# Patient Record
Sex: Male | Born: 1970 | Hispanic: Yes | Smoking: Current every day smoker
Health system: Southern US, Community
[De-identification: ages and names within clinical notes are randomized; demographics above are authoritative.]

## PROBLEM LIST (undated history)

## (undated) DIAGNOSIS — Z21 Asymptomatic human immunodeficiency virus [HIV] infection status: Secondary | ICD-10-CM

## (undated) DIAGNOSIS — J45909 Unspecified asthma, uncomplicated: Secondary | ICD-10-CM

## (undated) DIAGNOSIS — B20 Human immunodeficiency virus [HIV] disease: Secondary | ICD-10-CM

## (undated) DIAGNOSIS — E785 Hyperlipidemia, unspecified: Secondary | ICD-10-CM

## (undated) DIAGNOSIS — I639 Cerebral infarction, unspecified: Secondary | ICD-10-CM

## (undated) HISTORY — DX: Hyperlipidemia, unspecified: E78.5

## (undated) HISTORY — DX: Unspecified asthma, uncomplicated: J45.909

## (undated) HISTORY — PX: JOINT REPLACEMENT: SHX530

---

## 2017-08-29 ENCOUNTER — Encounter: Payer: Self-pay | Admitting: Pharmacist

## 2017-08-29 ENCOUNTER — Ambulatory Visit: Payer: Medicaid Other | Admitting: Pharmacist

## 2017-08-29 ENCOUNTER — Other Ambulatory Visit: Payer: Self-pay

## 2017-08-29 ENCOUNTER — Encounter (INDEPENDENT_AMBULATORY_CARE_PROVIDER_SITE_OTHER): Payer: Self-pay

## 2017-08-29 ENCOUNTER — Ambulatory Visit: Payer: Medicaid Other | Admitting: Pharmacy Technician

## 2017-08-29 DIAGNOSIS — Z79899 Other long term (current) drug therapy: Secondary | ICD-10-CM

## 2017-08-29 NOTE — Progress Notes (Addendum)
  [facility] Visit Note  Patient: Cody Amenedro Rasmussen Jr. MRN: [id number] Date of Birth: [birth year] PCP: No primary care provider on file.   Cody AmenPedro Cordle Jr. 47 y.o. male presents for an initial medication therapy management visit today.  There were no vitals taken for this visit.  Patient Information  No past medical history on file.   History reviewed. No pertinent surgical history.  No family history on file.  New Diagnoses (since last visit): NA  Family Support: Good         Social History   Substance and Sexual Activity  Alcohol Use Not on file    Social History   Tobacco Use  Smoking Status Not on file    Health Maintenance  Topic Date Due  . HIV Screening  06/05/1985  . TETANUS/TDAP  06/05/1989  . INFLUENZA VACCINE  12/07/2017   Outpatient Encounter Medications as of 08/29/2017  Medication Sig  . atorvastatin (LIPITOR) 40 MG tablet Take 40 mg by mouth daily.  . benztropine (COGENTIN) 1 MG tablet Take 1 mg by mouth at bedtime.  . divalproex (DEPAKOTE ER) 500 MG 24 hr tablet Take 1,000 mg by mouth at bedtime.  . haloperidol (HALDOL) 10 MG tablet Take 10 mg by mouth at bedtime.   No facility-administered encounter medications on file as of 08/29/2017.     Assessment and Plan:  Medication Compliance: Patient is able to correctly state the names of his medications (and previously discontinued medications), their indications, and dosing frequency with no prompting. He states that he does not use a pill box and would not be interested in using one because he never misses doses of his medications.    Dyslipidemia: Patient currently taking atorvastatin, well tolerated and well controlled. No labs available for review today.   Mental Health: Patient currently taking haloperidol, divalproex and benztropine. States that he feels that this combination of medications works best for him. He has previously been on dilantin and lithium which he feels did not  help. States that he knows the importance of taking his medications and will not let himself miss doses because he wants to stay out of the hospital. Sates that he dose feel somewhat drowsy throughout the day, despite taking his medications around 8 pm each night, but does not want to change anything about his current regimen as this is the best he has ever felt. Sates that if needed he can nap during the day and this helps him feel better.   Smoking Cessation: Patient currently smokes 3-4 cigarettes per day and dips when he is not smoking. States that he would like to quit smoking and dipping, and try NRT patches. Congratulated him on this decision and had him fill out a [employer] fax referral form during the visit which will be faxed today.    RTC: 1 year  Cori RazorLauren Markanthony Gedney, PharmD Candidate   Cosigned:Christan Leonor LivHolt, PharmD, RPh [facility] [phone]

## 2017-08-29 NOTE — Progress Notes (Signed)
Completed [facility] application and contract.  Patient agreed to all terms of the [facility] contract.    Patient will be approved to receive his HIV medications and some additional medications through the [facility] beginning 09/07/17.  [facility] for medications not available to patient through [facility].    Provided patient with community resource material based on his particular needs.    [employer] Prescription Application completed with patient.  [facility] for [facility].  Upon receipt of signed application from provider, [employer] Prescription Application will be submitted to manufacturer.  Sherilyn DacostaBetty J. Cecillia Menees Care Manager [facility]

## 2018-01-18 ENCOUNTER — Emergency Department
Admission: EM | Admit: 2018-01-18 | Discharge: 2018-01-18 | Disposition: A | Discharge status: Home / Self Care | Payer: Medicaid Other | Attending: Emergency Medicine | Admitting: Emergency Medicine

## 2018-01-18 ENCOUNTER — Emergency Department: Payer: Medicaid Other

## 2018-01-18 ENCOUNTER — Other Ambulatory Visit: Payer: Self-pay

## 2018-01-18 DIAGNOSIS — F17210 Nicotine dependence, cigarettes, uncomplicated: Secondary | ICD-10-CM | POA: Insufficient documentation

## 2018-01-18 DIAGNOSIS — Z21 Asymptomatic human immunodeficiency virus [HIV] infection status: Secondary | ICD-10-CM | POA: Insufficient documentation

## 2018-01-18 DIAGNOSIS — B20 Human immunodeficiency virus [HIV] disease: Secondary | ICD-10-CM

## 2018-01-18 DIAGNOSIS — Z79899 Other long term (current) drug therapy: Secondary | ICD-10-CM | POA: Insufficient documentation

## 2018-01-18 DIAGNOSIS — B59 Pneumocystosis: Secondary | ICD-10-CM | POA: Insufficient documentation

## 2018-01-18 HISTORY — DX: Human immunodeficiency virus (HIV) disease: B20

## 2018-01-18 HISTORY — DX: Asymptomatic human immunodeficiency virus (hiv) infection status: Z21

## 2018-01-18 MED ORDER — SULFAMETHOXAZOLE-TRIMETHOPRIM 800-160 MG PO TABS: 1.0000 | ORAL_TABLET | Freq: Two times a day (BID) | ORAL | 0 refills | Status: DC

## 2018-01-18 MED ORDER — ALBUTEROL SULFATE (2.5 MG/3ML) 0.083% IN NEBU
2.5000 mg | INHALATION_SOLUTION | Freq: Once | RESPIRATORY_TRACT | Status: AC
  Administered 2018-01-18: 2.5 mg via RESPIRATORY_TRACT
  Filled 2018-01-18: qty 3

## 2018-01-18 MED ORDER — ALBUTEROL SULFATE HFA 108 (90 BASE) MCG/ACT IN AERS: 2.0000 | INHALATION_SPRAY | RESPIRATORY_TRACT | 0 refills | Status: DC | PRN

## 2018-01-18 MED ORDER — PREDNISONE 50 MG PO TABS: 50.0000 mg | ORAL_TABLET | Freq: Every day | ORAL | 0 refills | Status: DC

## 2018-01-18 MED ORDER — SULFAMETHOXAZOLE-TRIMETHOPRIM 800-160 MG PO TABS
1.0000 | ORAL_TABLET | Freq: Once | ORAL | Status: AC
  Administered 2018-01-18: 1 via ORAL
  Filled 2018-01-18: qty 1

## 2018-01-18 MED ORDER — METHYLPREDNISOLONE SODIUM SUCC 125 MG IJ SOLR
125.0000 mg | Freq: Once | INTRAMUSCULAR | Status: AC
  Administered 2018-01-18: 125 mg via INTRAMUSCULAR
  Filled 2018-01-18: qty 2

## 2018-01-18 NOTE — ED Notes (Signed)
Pt stated that he has had a cough for the past 1.5 months and then  [facility] and has wheezing at night time. Pt stated that his cough is productive and it is green and yellow. Respirations even/unlabored. Pt denies any fever, chills, n/v/d or pain.

## 2018-01-18 NOTE — ED Provider Notes (Signed)
[facility] Emergency Department Provider Note  ____________________________________________  Time seen: Approximately 9:23 PM  I have reviewed the triage vital signs and the nursing notes.   HISTORY  Chief Complaint Cough    HPI Cody Jallow. is a 47 y.o. male who presents the emergency department complaining of cough, shortness of breath, wheezing, fatigue.  Patient reports that he has had increasing symptoms over the last  5 to 6 weeks.  Patient reports that he does have HIV, his CD4 count is undetectable.  He has been following his antiviral regimen.  Patient states that initially he thought he had some mild asthma or allergy symptoms.  He has had no nasal congestion, ear pain, sore throat.  He has had subjective, tactile intermittent temperatures.  He denies any currently.  Patient reports that over the several weeks, he has had progressive shortness of breath, progressive fatigue, progressive coughing, increased sputum production.  Patient reports that most of the sputum occurs in the morning and improves throughout the day.  Patient denies any headache, visual changes, neck pain or stiffness, chest pain, abdominal pain, nausea or vomiting, diarrhea or constipation.   History reviewed. No pertinent past medical history.  There are no active problems to display for this patient.   History reviewed. No pertinent surgical history.  Prior to Admission medications   Medication Sig Start Date End Date Taking? Authorizing Provider  albuterol (PROVENTIL HFA;VENTOLIN HFA) 108 (90 Base) MCG/ACT inhaler Inhale 2 puffs into the lungs every 4 (four) hours as needed for wheezing or shortness of breath. 01/18/18   Argie Lober, Delorise Royals, PA-C  atorvastatin (LIPITOR) 40 MG tablet Take 40 mg by mouth daily.    [provider]  benztropine (COGENTIN) 1 MG tablet Take 1 mg by mouth at bedtime.    [provider]  divalproex (DEPAKOTE ER) 500 MG 24 hr tablet  Take 1,000 mg by mouth at bedtime.    [provider]  haloperidol (HALDOL) 10 MG tablet Take 10 mg by mouth at bedtime.    [provider]  predniSONE (DELTASONE) 50 MG tablet Take 1 tablet (50 mg total) by mouth daily with breakfast. 01/18/18   Sheila Gervasi, Delorise Royals, PA-C  sulfamethoxazole-trimethoprim (BACTRIM DS,SEPTRA DS) 800-160 MG tablet Take 1 tablet by mouth 2 (two) times daily. 01/18/18   Hanah Moultry, Delorise Royals, PA-C    Allergies Penicillins  History reviewed. No pertinent family history.  Social History Social History   Tobacco Use  . Smoking status: Current Every Day Smoker    Packs/day: 0.40    Types: Cigarettes  . Smokeless tobacco: Current User    Types: Chew  Substance Use Topics  . Alcohol use: Not Currently  . Drug use: Not Currently    Types: "Crack" cocaine, Methamphetamines     Review of Systems  Constitutional: Intermittent, subjective, tactile fever/chills.  Increased fatigue. Eyes: No visual changes. No discharge ENT: No upper respiratory complaints. Cardiovascular: no chest pain. Respiratory: Positive cough.  Mild SOB. Gastrointestinal: No abdominal pain.  No nausea, no vomiting.  No diarrhea.  No constipation. Genitourinary: Negative for dysuria. No hematuria Musculoskeletal: Negative for musculoskeletal pain. Skin: Negative for rash, abrasions, lacerations, ecchymosis. Neurological: Negative for headaches, focal weakness or numbness. 10-point ROS otherwise negative.  ____________________________________________   PHYSICAL EXAM:  VITAL SIGNS: ED Triage Vitals  Enc Vitals Group     BP 01/18/18 1850 (!) 153/81     Pulse Rate 01/18/18 1850 70     Resp 01/18/18 1850 16  Temp 01/18/18 1850 98.8 F (37.1 C)     Temp Source 01/18/18 1850 Oral     SpO2 01/18/18 1850 97 %     Weight 01/18/18 1849 186 lb (84.4 kg)     Height 01/18/18 1849 5\' 8"  (1.727 m)     Head Circumference --      Peak Flow --      Pain Score 01/18/18  1849 0     Pain Loc --      Pain Edu? --      Excl. in GC? --      Constitutional: Alert and oriented. Well appearing and in no acute distress. Eyes: Conjunctivae are normal. PERRL. EOMI. Head: Atraumatic. ENT:      Ears: EACs and TMs unremarkable bilaterally.      Nose: No congestion/rhinnorhea.      Mouth/Throat: Mucous membranes are moist.  Neck: No stridor.   Hematological/Lymphatic/Immunilogical: No cervical lymphadenopathy. Cardiovascular: Normal rate, regular rhythm. Normal S1 and S2.  Good peripheral circulation. Respiratory: Normal respiratory effort without tachypnea or retractions. Lungs few scattered expiratory wheezes, coarse breath sounds.  No rales or rhonchi.Peri [place] air entry to the bases with no decreased or absent breath sounds. Musculoskeletal: Full range of motion to all extremities. No gross deformities appreciated. Neurologic:  Normal speech and language. No gross focal neurologic deficits are appreciated.  Skin:  Skin is warm, dry and intact. No rash noted. Psychiatric: Mood and affect are normal. Speech and behavior are normal. Patient exhibits appropriate insight and judgement.   ____________________________________________   LABS (all labs ordered are listed, but only abnormal results are displayed)  Labs Reviewed - No data to display ____________________________________________  EKG   ____________________________________________  RADIOLOGY I personally viewed and evaluated these images as part of my medical decision making, as well as reviewing the written report by the radiologist.  I concur with radiologist finding of no acute consolidations.  Dg Chest 2 View  Result Date: 01/18/2018 CLINICAL DATA:  Patient with productive cough EXAM: CHEST - 2 VIEW COMPARISON:  None. FINDINGS: Normal cardiac and mediastinal contours. No consolidative pulmonary opacities. No pleural effusion or pneumothorax. IMPRESSION: No acute cardiopulmonary process.  Electronically Signed   By: Annia Belt M.D.   On: 01/18/2018 20:04    ____________________________________________    PROCEDURES  Procedure(s) performed:    Procedures    Medications  sulfamethoxazole-trimethoprim (BACTRIM DS,SEPTRA DS) 800-160 MG per tablet 1 tablet (has no administration in time range)  albuterol (PROVENTIL) (2.5 MG/3ML) 0.083% nebulizer solution 2.5 mg (has no administration in time range)  methylPREDNISolone sodium succinate (SOLU-MEDROL) 125 mg/2 mL injection 125 mg (has no administration in time range)     ____________________________________________   INITIAL IMPRESSION / ASSESSMENT AND PLAN / ED COURSE  Pertinent labs & imaging results that were available during my care of the patient were reviewed by me and considered in my medical decision making (see chart for details).  Review of the [place] CSRS was performed in accordance of the [facility] prior to dispensing any controlled drugs.  Clinical Course as of Jan 19 2144  Thu Jan 18, 2018  2134 Patient presents the emergency department complaining of 5 to 6-week history of increasing cough, shortness of breath, fatigue, intermittent, subjective fevers.  Patient presents afebrile, normal respiration rate, O2 sats in the upper 90 percentile.  Chest x-ray reveals no acute findings.  In discussing symptoms with patient, patient reports that he is HIV positive, undergoing antiviral therapy.  Last CD4 count, 6  months prior was undetectable.  Differential includes asthma exacerbation, bronchitis, pneumonia.  Given patient's HIV status, pneumocystis infection is also in the differential.  After lengthy discussion with the patient, I will treat patient empirically at this time on an outpatient basis for pneumocystis.  I advised patient to contact his primary care tomorrow for sputum test.  Patient is unable to provide a sample at this time in the emergency department.  No indication for CT scan, labs as patient is stable at  this time.  Patient is to follow-up with infectious disease who manages his HIV status for further testing and final diagnosis of pneumocystis.  Patient verbalizes understanding of likely diagnosis, need for follow-up.   [JC]  2139 Differential also includes Mycobacterium pneumonia, fungal infection, tuberculosis given HIV status.   [JC]    Clinical Course User Index [JC] Kaylee Trivett, Delorise RoyalsJonathan D, PA-C     Patient's diagnosis is consistent with pneumocystis pneumonia and HIV infection.  Patient presents the emergency department with a 6-week history of progressive cough, shortness of breath, increased sputum production, fatigue.  Patient is HIV positive.  He states that his last CD4 count was undetectable.  However when questioned whether this was viral load or CD4 count, patient is unsure.  He states that he takes his antiviral as prescribed and "does not really have any other problems.".  He reports that according to his provider he is well managed.  He is unsure of his providers name..  While differential includes bronchitis, pneumonia, viral URI, allergies the patient's history of HIV also includes pneumocystis pneumonia, TB, fungal infection.  Patient symptoms are consistent with pneumocystis pneumonia but he is unable to provide a sputum sample at this time.  Patient will be started on Bactrim for same and is advised to follow-up with either his primary care or infectious disease for further testing.  Patient verbalizes understanding of diagnosis, likely differential.  He verbalizes he will follow-up with infectious disease or primary care for further work-up as this condition may require ongoing testing and/or treatment.  Patient is given ED precautions to return to the ED for any worsening or new symptoms.     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 Despite best efforts to proofread, errors can occur which can change the meaning. Any change was purely unintentional.    Lanette HampshireCuthriell, Charlene Cowdrey D, PA-C 01/18/18 2157    Rockne MenghiniNorman, Anne-Caroline, MD 01/18/18 [phone]

## 2018-01-18 NOTE — ED Triage Notes (Signed)
Pt arrives to ED with cough x 1.5 months. States productive with green sputum. Denies fever. Alert, oriented.

## 2018-03-22 ENCOUNTER — Emergency Department: Payer: Medicaid Other

## 2018-03-22 ENCOUNTER — Emergency Department
Admission: EM | Admit: 2018-03-22 | Discharge: 2018-03-22 | Disposition: A | Discharge status: Home / Self Care | Payer: Medicaid Other | Attending: Emergency Medicine | Admitting: Emergency Medicine

## 2018-03-22 ENCOUNTER — Encounter: Payer: Self-pay | Admitting: Emergency Medicine

## 2018-03-22 ENCOUNTER — Other Ambulatory Visit: Payer: Self-pay

## 2018-03-22 DIAGNOSIS — Y929 Unspecified place or not applicable: Secondary | ICD-10-CM | POA: Insufficient documentation

## 2018-03-22 DIAGNOSIS — F17210 Nicotine dependence, cigarettes, uncomplicated: Secondary | ICD-10-CM | POA: Insufficient documentation

## 2018-03-22 DIAGNOSIS — W0110XA Fall on same level from slipping, tripping and stumbling with subsequent striking against unspecified object, initial encounter: Secondary | ICD-10-CM | POA: Insufficient documentation

## 2018-03-22 DIAGNOSIS — Y990 Civilian activity done for income or pay: Secondary | ICD-10-CM | POA: Insufficient documentation

## 2018-03-22 DIAGNOSIS — Y939 Activity, unspecified: Secondary | ICD-10-CM | POA: Insufficient documentation

## 2018-03-22 DIAGNOSIS — S43005A Unspecified dislocation of left shoulder joint, initial encounter: Secondary | ICD-10-CM | POA: Insufficient documentation

## 2018-03-22 DIAGNOSIS — Z79899 Other long term (current) drug therapy: Secondary | ICD-10-CM | POA: Insufficient documentation

## 2018-03-22 MED ORDER — KETAMINE HCL 10 MG/ML IJ SOLN
1.0000 mg/kg | Freq: Once | INTRAMUSCULAR | Status: AC
  Administered 2018-03-22: 84 mg via INTRAVENOUS
  Filled 2018-03-22: qty 1

## 2018-03-22 MED ORDER — SODIUM CHLORIDE 0.9 % IV BOLUS
1000.0000 mL | Freq: Once | INTRAVENOUS | Status: AC
  Administered 2018-03-22: 1000 mL via INTRAVENOUS

## 2018-03-22 MED ORDER — FENTANYL CITRATE (PF) 100 MCG/2ML IJ SOLN
50.0000 ug | INTRAMUSCULAR | Status: DC | PRN
  Administered 2018-03-22: 50 ug via INTRAVENOUS
  Filled 2018-03-22: qty 2

## 2018-03-22 NOTE — ED Provider Notes (Signed)
[facility] Emergency Department Provider Note  ____________________________________________   First MD Initiated Contact with Patient 03/22/18 1936     (approximate)  I have reviewed the triage vital signs and the nursing notes.   HISTORY  Chief Complaint Shoulder Injury   HPI Caillou Minus. is a 47 y.o. male with history of multiple shoulder dislocations as well as HIV who is presenting the emergency department today with a left shoulder dislocation.  He says that he slipped and fell at work, hitting the left shoulder against the ground.  He is not reporting any loss of consciousness.  Denies any headache or chest pain.  States that he has had surgery with hardware to left shoulder in the past due to football injury that was remote.  Says last time he was 2 or 3 PM this afternoon.   Past Medical History:  Diagnosis Date  . HIV (human immunodeficiency virus infection) (HCC)     There are no active problems to display for this patient.   History reviewed. No pertinent surgical history.  Prior to Admission medications   Medication Sig Start Date End Date Taking? Authorizing Provider  albuterol (PROVENTIL HFA;VENTOLIN HFA) 108 (90 Base) MCG/ACT inhaler Inhale 2 puffs into the lungs every 4 (four) hours as needed for wheezing or shortness of breath. 01/18/18   Cuthriell, Delorise Royals, PA-C  atorvastatin (LIPITOR) 40 MG tablet Take 40 mg by mouth daily.    [provider]  benztropine (COGENTIN) 1 MG tablet Take 1 mg by mouth at bedtime.    [provider]  divalproex (DEPAKOTE ER) 500 MG 24 hr tablet Take 1,000 mg by mouth at bedtime.    [provider]  haloperidol (HALDOL) 10 MG tablet Take 10 mg by mouth at bedtime.    [provider]  predniSONE (DELTASONE) 50 MG tablet Take 1 tablet (50 mg total) by mouth daily with breakfast. 01/18/18   Cuthriell, Delorise Royals, PA-C  sulfamethoxazole-trimethoprim (BACTRIM DS,SEPTRA DS)  800-160 MG tablet Take 1 tablet by mouth 2 (two) times daily. 01/18/18   Cuthriell, Delorise Royals, PA-C    Allergies Penicillins  No family history on file.  Social History Social History   Tobacco Use  . Smoking status: Current Every Day Smoker    Packs/day: 0.40    Types: Cigarettes  . Smokeless tobacco: Current User    Types: Chew  Substance Use Topics  . Alcohol use: Not Currently  . Drug use: Not Currently    Types: "Crack" cocaine, Methamphetamines    Review of Systems  Constitutional: No fever/chills Eyes: No visual changes. ENT: No sore throat. Cardiovascular: Denies chest pain. Respiratory: Denies shortness of breath. Gastrointestinal: No abdominal pain.  No nausea, no vomiting.  No diarrhea.  No constipation. Genitourinary: Negative for dysuria. Musculoskeletal: Negative for back pain. Skin: Negative for rash. Neurological: Negative for headaches, focal weakness or numbness.   ____________________________________________   PHYSICAL EXAM:  VITAL SIGNS: ED Triage Vitals  Enc Vitals Group     BP 03/22/18 1925 (!) 164/107     Pulse Rate 03/22/18 1925 (!) 107     Resp 03/22/18 1925 18     Temp 03/22/18 1925 98.2 F (36.8 C)     Temp Source 03/22/18 1925 Oral     SpO2 03/22/18 1925 99 %     Weight 03/22/18 1930 185 lb (83.9 kg)     Height 03/22/18 1930 5\' 8"  (1.727 m)     Head Circumference --  Peak Flow --      Pain Score 03/22/18 1929 10     Pain Loc --      Pain Edu? --      Excl. in GC? --     Constitutional: Alert and oriented.  Patient with distress.  Wincing.  Holding his left arm in place with his right arm.  Left upper extremity is flexed at the elbow and held in internal rotation. Eyes: Conjunctivae are normal.  Head: Atraumatic. Nose: No congestion/rhinnorhea. Mouth/Throat: Mucous membranes are moist.  Neck: No stridor.   Cardiovascular: Normal rate, regular rhythm. Grossly normal heart sounds.  Good peripheral circulation with equal  bilateral radial pulses. Respiratory: Normal respiratory effort.  No retractions. Lungs CTAB. Gastrointestinal: Soft and nontender. No distention. No CVA tenderness. Musculoskeletal: No lower extremity tenderness nor edema.  No joint effusions.  Left shoulder deformity.  Patient denies any sensation over the left deltoid to light touch.  5 out of 5 strength distally with equal and bilateral radial pulses.  Neurologic:  Normal speech and language. No gross focal neurologic deficits are appreciated. Skin:  Skin is warm, dry and intact. No rash noted. Psychiatric: Mood and affect are normal. Speech and behavior are normal.  ____________________________________________   LABS (all labs ordered are listed, but only abnormal results are displayed)  Labs Reviewed - No data to display ____________________________________________  EKG   ____________________________________________  RADIOLOGY  Anterior shoulder dislocation on left.  No acute fracture identified ____________________________________________   PROCEDURES  Procedure(s) performed:    .Sedation Date/Time: 03/22/2018 8:32 PM Performed by: Myrna BlazerSchaevitz, Neeka Urista Matthew, MD Authorized by: Myrna BlazerSchaevitz, Jozef Eisenbeis Matthew, MD   Consent:    Consent obtained:  Written (electronic informed consent)   Consent given by:  Parent   Risks discussed:  Allergic reaction, dysrhythmia, inadequate sedation, nausea, vomiting, respiratory compromise necessitating ventilatory assistance and intubation, prolonged sedation necessitating reversal and prolonged hypoxia resulting in organ damage   Alternatives discussed:  Analgesia without sedation Universal protocol:    Procedure explained and questions answered to patient or proxy's satisfaction: yes     Relevant documents present and verified: yes     Test results available and properly labeled: yes     Imaging studies available: yes     Required blood products, implants, devices, and special  equipment available: yes     Immediately prior to procedure a time out was called: yes     Patient identity confirmation method:  Arm band Indications:    Procedure performed:  Dislocation reduction Pre-sedation assessment:    Time since last food or drink:  5-6 hours   ASA classification: class 2 - patient with mild systemic disease     ASA classification comment:  Hiv.  compliant with meds.    Neck mobility: normal     Mouth opening:  3 or more finger widths   Thyromental distance:  4 finger widths   Mallampati score:  I - soft palate, uvula, fauces, pillars visible   Pre-sedation assessments completed and reviewed: airway patency     Pre-sedation assessment completed:  03/22/2018 8:33 PM Immediate pre-procedure details:    Reassessment: Patient reassessed immediately prior to procedure     Reviewed: vital signs, relevant labs/tests and NPO status     Verified: bag valve mask available, emergency equipment available, intubation equipment available, IV patency confirmed, oxygen available, reversal medications available and suction available   Procedure details (see MAR for exact dosages):    Preoxygenation:  Nasal cannula   Sedation:  Ketamine   Intra-procedure monitoring:  Blood pressure monitoring, continuous pulse oximetry, cardiac monitor, frequent vital sign checks and frequent LOC assessments   Intra-procedure events: none     Total Provider sedation time (minutes):  15 Post-procedure details:    Post-sedation assessment completed:  03/22/2018 9:54 PM   Attendance: Constant attendance by certified staff until patient recovered     Recovery: Patient returned to pre-procedure baseline     Post-sedation assessments completed and reviewed: airway patency, cardiovascular function, hydration status, mental status and respiratory function     Patient is stable for discharge or admission: yes     Patient tolerance:  Tolerated well, no immediate complications Comments:     Patient back  to baseline at this time.  Pain-free.  Reduction of dislocation Date/Time: 03/22/2018 9:55 PM Performed by: Myrna Blazer, MD Authorized by: Myrna Blazer, MD  Consent: Written consent obtained. Consent given by: patient and spouse Patient understanding: patient states understanding of the procedure being performed Patient consent: the patient's understanding of the procedure matches consent given Procedure consent: procedure consent matches procedure scheduled Relevant documents: relevant documents present and verified Test results: test results available and properly labeled Patient identity confirmed: verbally with patient  Sedation: Patient sedated: yes Sedatives: ketamine  Patient tolerance: Patient tolerated the procedure well with no immediate complications Comments: Patient sedated with ketamine.  Gentle downward traction pulled on the left forearm and arm with a flexed elbow on the left and the joint reduced smoothly and easily.  Patient placed in a shoulder immobilizer.  Patient with 5 out of 5 strength distally and with sensation is intact over the deltoid on the left.     Critical Care performed:   ____________________________________________   INITIAL IMPRESSION / ASSESSMENT AND PLAN / ED COURSE  Pertinent labs & imaging results that were available during my care of the patient were reviewed by me and considered in my medical decision making (see chart for details).  DDX: Shoulder dislocation, shoulder fracture, As part of my medical decision making, I reviewed the following data within the electronic MEDICAL RECORD NUMBER Notes from prior ED visits  ----------------------------------------- 9:57 PM on 03/22/2018 -----------------------------------------  Patient back to baseline at this time.  Will remain in shoulder immobilizer.  He is aware of this plan as well as for follow-up with orthopedics here at the hospital.  Prior to the reduction I  discussed case Dr. Odis Luster who said there was no modification of the reduction procedure because of the patient's previous shoulder surgery.  Patient aware of successful reduction as well. ____________________________________________   FINAL CLINICAL IMPRESSION(S) / ED DIAGNOSES  Shoulder dislocation.   NEW MEDICATIONS STARTED DURING THIS VISIT:  New Prescriptions   No medications on file     Note:  This document was prepared using Dragon voice recognition software and may include unintentional dictation errors.     Myrna Blazer, MD 03/22/18 2157

## 2018-03-22 NOTE — ED Notes (Signed)
Pt is arousable by touch. Pt is able to squeeze the left hand. Dr Pershing ProudSchaevitz is at the bedside.

## 2018-03-22 NOTE — ED Notes (Signed)
Left shoulder reduced. Pt tolerated well. Pt eyes open at times but pt is cooperative and not resistant to care.

## 2018-03-22 NOTE — ED Triage Notes (Signed)
Patient to ER for c/o "dislocated shoulder". Patient states he fell approx one hour ago, has h/o the same with resulting surgery.

## 2018-03-22 NOTE — ED Notes (Signed)
Pt reports no pain at this time. This RN continues to stay at the bedside.

## 2018-03-22 NOTE — ED Notes (Signed)
Pt verbalized understanding as well as fiance. Pt is alert and oriented.

## 2018-06-09 ENCOUNTER — Other Ambulatory Visit: Payer: Self-pay

## 2018-06-09 ENCOUNTER — Emergency Department
Admission: EM | Admit: 2018-06-09 | Discharge: 2018-06-09 | Disposition: A | Discharge status: Home / Self Care | Payer: Medicaid Other | Attending: Emergency Medicine | Admitting: Emergency Medicine

## 2018-06-09 ENCOUNTER — Emergency Department: Payer: Medicaid Other

## 2018-06-09 ENCOUNTER — Encounter: Payer: Self-pay | Admitting: Emergency Medicine

## 2018-06-09 DIAGNOSIS — Z79899 Other long term (current) drug therapy: Secondary | ICD-10-CM | POA: Insufficient documentation

## 2018-06-09 DIAGNOSIS — J209 Acute bronchitis, unspecified: Secondary | ICD-10-CM | POA: Insufficient documentation

## 2018-06-09 DIAGNOSIS — F17210 Nicotine dependence, cigarettes, uncomplicated: Secondary | ICD-10-CM | POA: Insufficient documentation

## 2018-06-09 DIAGNOSIS — Z21 Asymptomatic human immunodeficiency virus [HIV] infection status: Secondary | ICD-10-CM | POA: Insufficient documentation

## 2018-06-09 LAB — COMPREHENSIVE METABOLIC PANEL
ALBUMIN: 4.3 g/dL (ref 3.5–5.0)
ALK PHOS: 124 U/L (ref 38–126)
ALT: 31 U/L (ref 0–44)
ANION GAP: 5 (ref 5–15)
AST: 29 U/L (ref 15–41)
BILIRUBIN TOTAL: 0.8 mg/dL (ref 0.3–1.2)
BUN: 9 mg/dL (ref 6–20)
CALCIUM: 9 mg/dL (ref 8.9–10.3)
CO2: 26 mmol/L (ref 22–32)
Chloride: 105 mmol/L (ref 98–111)
Creatinine, Ser: 1.11 mg/dL (ref 0.61–1.24)
GFR calc non Af Amer: >60 mL/min (ref >60)
GLUCOSE: 98 mg/dL (ref 70–99)
Potassium: 3.9 mmol/L (ref 3.5–5.1)
Sodium: 136 mmol/L (ref 135–145)
TOTAL PROTEIN: 7.2 g/dL (ref 6.5–8.1)

## 2018-06-09 LAB — CBC WITH DIFFERENTIAL/PLATELET
Abs Immature Granulocytes: 0.02 10*3/uL (ref 0.00–0.07)
BASOS PCT: 1 %
Basophils Absolute: 0.1 10*3/uL (ref 0.0–0.1)
EOS ABS: 0.1 10*3/uL (ref 0.0–0.5)
Eosinophils Relative: 1 %
HCT: 43.2 % (ref 39.0–52.0)
Hemoglobin: 15 g/dL (ref 13.0–17.0)
IMMATURE GRANULOCYTES: 0 %
Lymphocytes Relative: 20 %
Lymphs Abs: 1.5 10*3/uL (ref 0.7–4.0)
MCH: 31.4 pg (ref 26.0–34.0)
MCHC: 34.7 g/dL (ref 30.0–36.0)
MCV: 90.6 fL (ref 80.0–100.0)
MONOS PCT: 7 %
Monocytes Absolute: 0.6 10*3/uL (ref 0.1–1.0)
NEUTROS PCT: 71 %
Neutro Abs: 5.3 10*3/uL (ref 1.7–7.7)
PLATELETS: 273 10*3/uL (ref 150–400)
RBC: 4.77 MIL/uL (ref 4.22–5.81)
RDW: 12.3 % (ref 11.5–15.5)
WBC: 7.5 10*3/uL (ref 4.0–10.5)
nRBC: 0 % (ref 0.0–0.2)

## 2018-06-09 MED ORDER — DEXAMETHASONE 6 MG PO TABS: 12.0000 mg | ORAL_TABLET | Freq: Once | ORAL | 0 refills | Status: AC

## 2018-06-09 MED ORDER — DEXAMETHASONE 4 MG PO TABS
12.0000 mg | ORAL_TABLET | Freq: Once | ORAL | Status: AC
  Administered 2018-06-09: 12 mg via ORAL
  Filled 2018-06-09: qty 3

## 2018-06-09 MED ORDER — IPRATROPIUM-ALBUTEROL 0.5-2.5 (3) MG/3ML IN SOLN
3.0000 mL | Freq: Once | RESPIRATORY_TRACT | Status: AC
  Administered 2018-06-09: 3 mL via RESPIRATORY_TRACT
  Filled 2018-06-09: qty 3

## 2018-06-09 MED ORDER — ALBUTEROL SULFATE HFA 108 (90 BASE) MCG/ACT IN AERS: 2.0000 | INHALATION_SPRAY | RESPIRATORY_TRACT | 0 refills | Status: DC | PRN

## 2018-06-09 NOTE — Discharge Instructions (Addendum)
Take another dose of two pills of the dexamethasone on Monday after you get the prescription filled.  Use albuterol via inhaler, two puffs every 4-6 hours over the next several days.  Follow up with your regular doctor.  Return to the ER for new or worsening cough, shortness of breath, lightheadedness or any other new or worsening symptoms that concern you.

## 2018-06-09 NOTE — ED Triage Notes (Signed)
Cough X 3 week. Unlabored currently. Gets SHOB when coughing.  No pain at this time. No known fevers.  Pt is HIV positive.  NAD. Ambulatory.  Is a smoker.

## 2018-06-09 NOTE — ED Provider Notes (Signed)
[facility] Emergency Department Provider Note ____________________________________________   First MD Initiated Contact with Patient 06/09/18 1556     (approximate)  I have reviewed the triage vital signs and the nursing notes.   HISTORY  Chief Complaint Cough    HPI Cody Craig. is a 48 y.o. male with PMH of HIV who presents with cough over the last 3 weeks, nonproductive, associated with some lightheadedness when he coughs and with some nausea.  He denies fevers, chest pain, shortness of breath or vomiting.  He has not taken anything for it.    Past Medical History:  Diagnosis Date  . HIV (human immunodeficiency virus infection) (HCC)     There are no active problems to display for this patient.   History reviewed. No pertinent surgical history.  Prior to Admission medications   Medication Sig Start Date End Date Taking? Authorizing Provider  albuterol (PROVENTIL HFA;VENTOLIN HFA) 108 (90 Base) MCG/ACT inhaler Inhale 2 puffs into the lungs every 4 (four) hours as needed for wheezing or shortness of breath. 01/18/18   Cuthriell, Delorise Royals, PA-C  albuterol (PROVENTIL HFA;VENTOLIN HFA) 108 (90 Base) MCG/ACT inhaler Inhale 2 puffs into the lungs every 4 (four) hours as needed for wheezing or shortness of breath. 06/09/18   Dionne Bucy, MD  atorvastatin (LIPITOR) 40 MG tablet Take 40 mg by mouth daily.    [provider]  benztropine (COGENTIN) 1 MG tablet Take 1 mg by mouth at bedtime.    [provider]  dexamethasone (DECADRON) 6 MG tablet Take 2 tablets (12 mg total) by mouth once for 1 dose. 06/11/18 06/11/18  Dionne Bucy, MD  divalproex (DEPAKOTE ER) 500 MG 24 hr tablet Take 1,000 mg by mouth at bedtime.    [provider]  haloperidol (HALDOL) 10 MG tablet Take 10 mg by mouth at bedtime.    [provider]  predniSONE (DELTASONE) 50 MG tablet Take 1 tablet (50 mg total) by mouth daily with  breakfast. 01/18/18   Cuthriell, Delorise Royals, PA-C  sulfamethoxazole-trimethoprim (BACTRIM DS,SEPTRA DS) 800-160 MG tablet Take 1 tablet by mouth 2 (two) times daily. 01/18/18   Cuthriell, Delorise Royals, PA-C    Allergies Penicillins  History reviewed. No pertinent family history.  Social History Social History   Tobacco Use  . Smoking status: Current Every Day Smoker    Packs/day: 0.40    Types: Cigarettes  . Smokeless tobacco: Current User    Types: Chew  Substance Use Topics  . Alcohol use: Not Currently  . Drug use: Not Currently    Types: "Crack" cocaine, Methamphetamines    Review of Systems  Constitutional: No fever. Eyes: No redness. ENT: No sore throat. Positive for nasal congestion.  Cardiovascular: Denies chest pain. Respiratory: Denies shortness of breath. Gastrointestinal: No vomiting or diarrhea.  Genitourinary: Negative for flank pain.  Musculoskeletal: Negative for back pain. Skin: Negative for rash. Neurological: Negative for headache.   ____________________________________________   PHYSICAL EXAM:  VITAL SIGNS: ED Triage Vitals [06/09/18 1249]  Enc Vitals Group     BP      Pulse      Resp      Temp      Temp src      SpO2      Weight 179 lb (81.2 kg)     Height 5\' 8"  (1.727 m)     Head Circumference      Peak Flow      Pain Score 0  Pain Loc      Pain Edu?      Excl. in GC?     Constitutional: Alert and oriented. Well appearing and in no acute distress. Eyes: Conjunctivae are normal.  Head: Atraumatic. Nose: No congestion/rhinnorhea. Mouth/Throat: Mucous membranes are moist.   Neck: Normal range of motion.  Cardiovascular: Normal rate, regular rhythm. Grossly normal heart sounds.  Good peripheral circulation. Respiratory: Normal respiratory effort.  No retractions. Lungs CTAB. Gastrointestinal: No distention.  Musculoskeletal:  Extremities warm and well perfused.  Neurologic:  Normal speech and language. No gross focal neurologic  deficits are appreciated.  Skin:  Skin is warm and dry. No rash noted. Psychiatric: Mood and affect are normal. Speech and behavior are normal.  ____________________________________________   LABS (all labs ordered are listed, but only abnormal results are displayed)  Labs Reviewed  COMPREHENSIVE METABOLIC PANEL  CBC WITH DIFFERENTIAL/PLATELET   ____________________________________________  EKG  ED ECG REPORT I, Dionne BucySebastian Zera Markwardt, the attending physician, personally viewed and interpreted this ECG.  Date: 06/09/2018 EKG Time: 1257 Rate: 74 Rhythm: normal sinus rhythm QRS Axis: normal Intervals: normal ST/T Wave abnormalities: normal Narrative Interpretation: no evidence of acute ischemia ____________________________________________  RADIOLOGY  CXR: No focal infiltrate or other acute abnormality.   ____________________________________________   PROCEDURES  Procedure(s) performed: No  Procedures  Critical Care performed: No ____________________________________________   INITIAL IMPRESSION / ASSESSMENT AND PLAN / ED COURSE  Pertinent labs & imaging results that were available during my care of the patient were reviewed by me and considered in my medical decision making (see chart for details).  48 year old male with a history of HIV on antiretrovirals presents with cough for the last 3 weeks with some intermittent lightheadedness and nausea as well as nasal congestion and rhinorrhea.   On exam the patient is well appearing.  His vital signs are normal.  His exam is unremarkable.  His lungs are clear and the remainder of the exam is as described above.    Overall presentation is consistent with viral bronchitis given the duration and the presence of URI symptoms.  Lab workup and chest XR obtained from triage are within normal limits.  There is no evidence of pneumonia.  I will treat with bronchodilators and steroid.  The patient has albuterol at home but has not  been using it so I instructed him to do so, and I will give a dose of decadron here and another one for him to take in 2 days as he will be unable to fill his prescriptions today or tomorrow (his pharmacy is not open on the weekend).  At this time the patient is stable for discharge home.  He agrees with the treatment plan. Return precautions given and he expresses understanding.    ____________________________________________   FINAL CLINICAL IMPRESSION(S) / ED DIAGNOSES  Final diagnoses:  Acute bronchitis, unspecified organism      NEW MEDICATIONS STARTED DURING THIS VISIT:  Discharge Medication List as of 06/09/2018  5:36 PM    START taking these medications   Details  !! albuterol (PROVENTIL HFA;VENTOLIN HFA) 108 (90 Base) MCG/ACT inhaler Inhale 2 puffs into the lungs every 4 (four) hours as needed for wheezing or shortness of breath., Starting Sat 06/09/2018, Print    dexamethasone (DECADRON) 6 MG tablet Take 2 tablets (12 mg total) by mouth once for 1 dose., Starting Mon 06/11/2018, Print     !! - Potential duplicate medications found. Please discuss with provider.  Note:  This document was prepared using Dragon voice recognition software and may include unintentional dictation errors.   Dionne Bucy, MD 06/09/18 [phone]

## 2018-06-12 ENCOUNTER — Ambulatory Visit: Payer: Medicaid Other | Admitting: Pharmacy Technician

## 2018-06-12 DIAGNOSIS — Z79899 Other long term (current) drug therapy: Secondary | ICD-10-CM

## 2018-06-13 ENCOUNTER — Emergency Department
Admission: EM | Admit: 2018-06-13 | Discharge: 2018-06-13 | Disposition: A | Discharge status: Home / Self Care | Payer: Medicaid Other | Attending: Emergency Medicine | Admitting: Emergency Medicine

## 2018-06-13 ENCOUNTER — Emergency Department: Payer: Medicaid Other

## 2018-06-13 ENCOUNTER — Other Ambulatory Visit: Payer: Self-pay

## 2018-06-13 DIAGNOSIS — J40 Bronchitis, not specified as acute or chronic: Secondary | ICD-10-CM

## 2018-06-13 DIAGNOSIS — Z21 Asymptomatic human immunodeficiency virus [HIV] infection status: Secondary | ICD-10-CM | POA: Insufficient documentation

## 2018-06-13 DIAGNOSIS — Z79899 Other long term (current) drug therapy: Secondary | ICD-10-CM | POA: Insufficient documentation

## 2018-06-13 DIAGNOSIS — R05 Cough: Secondary | ICD-10-CM

## 2018-06-13 DIAGNOSIS — R059 Cough, unspecified: Secondary | ICD-10-CM

## 2018-06-13 DIAGNOSIS — F17210 Nicotine dependence, cigarettes, uncomplicated: Secondary | ICD-10-CM | POA: Insufficient documentation

## 2018-06-13 DIAGNOSIS — J209 Acute bronchitis, unspecified: Secondary | ICD-10-CM | POA: Insufficient documentation

## 2018-06-13 LAB — CBC
HCT: 42.1 % (ref 39.0–52.0)
Hemoglobin: 14.7 g/dL (ref 13.0–17.0)
MCH: 31.9 pg (ref 26.0–34.0)
MCHC: 34.9 g/dL (ref 30.0–36.0)
MCV: 91.3 fL (ref 80.0–100.0)
PLATELETS: 269 10*3/uL (ref 150–400)
RBC: 4.61 MIL/uL (ref 4.22–5.81)
RDW: 12.4 % (ref 11.5–15.5)
WBC: 19.4 10*3/uL — ABNORMAL HIGH (ref 4.0–10.5)
nRBC: 0 % (ref 0.0–0.2)

## 2018-06-13 LAB — BASIC METABOLIC PANEL
ANION GAP: 8 (ref 5–15)
BUN: 19 mg/dL (ref 6–20)
CALCIUM: 9.3 mg/dL (ref 8.9–10.3)
CHLORIDE: 101 mmol/L (ref 98–111)
CO2: 25 mmol/L (ref 22–32)
Creatinine, Ser: 1.01 mg/dL (ref 0.61–1.24)
Glucose, Bld: 120 mg/dL — ABNORMAL HIGH (ref 70–99)
POTASSIUM: 4.1 mmol/L (ref 3.5–5.1)
SODIUM: 134 mmol/L — AB (ref 135–145)

## 2018-06-13 LAB — TROPONIN I

## 2018-06-13 MED ORDER — BENZONATATE 100 MG PO CAPS
200.0000 mg | ORAL_CAPSULE | Freq: Once | ORAL | Status: AC
  Administered 2018-06-13: 200 mg via ORAL
  Filled 2018-06-13: qty 2

## 2018-06-13 MED ORDER — BENZONATATE 100 MG PO CAPS: 100.0000 mg | ORAL_CAPSULE | Freq: Four times a day (QID) | ORAL | 0 refills | Status: DC | PRN

## 2018-06-13 NOTE — ED Triage Notes (Signed)
Pt states "I've been coughing but I passed out 3 times today from coughing." A&O, ambulatory. No distress noted.

## 2018-06-13 NOTE — Discharge Instructions (Addendum)
Please seek medical attention for any high fevers, chest pain, shortness of breath, change in behavior, persistent vomiting, bloody stool or any other new or concerning symptoms.  

## 2018-06-13 NOTE — ED Provider Notes (Signed)
[facility] Emergency Department Provider Note  ____________________________________________   I have reviewed the triage vital signs and the nursing notes.   HISTORY  Chief Complaint Cough and Loss of Consciousness   History limited by: Not Limited   HPI Cody Amenedro Foor Jr. is a 48 y.o. male who presents to the emergency department today with concerns for continued cough and now syncopal episodes.  Patient states that he has been taking his albuterol since his recent ER visit.  Today when he was coughing he says he passed out.  He states this happened a couple times when he was coughing.  It never happened he was not coughing.  Patient denies any chest pain with these episodes.  States he is never passed out from coughing in the past.  He denies any history of heart disease but states his mother had heart disease.  Patient has been taking some NyQuil but no other cough medication.   Per medical record review patient has a history of recent ER visit with CXR negative for pneumonia.   Past Medical History:  Diagnosis Date  . HIV (human immunodeficiency virus infection) (HCC)     There are no active problems to display for this patient.   History reviewed. No pertinent surgical history.  Prior to Admission medications   Medication Sig Start Date End Date Taking? Authorizing Provider  albuterol (PROVENTIL HFA;VENTOLIN HFA) 108 (90 Base) MCG/ACT inhaler Inhale 2 puffs into the lungs every 4 (four) hours as needed for wheezing or shortness of breath. 01/18/18   Cuthriell, Delorise RoyalsJonathan D, PA-C  albuterol (PROVENTIL HFA;VENTOLIN HFA) 108 (90 Base) MCG/ACT inhaler Inhale 2 puffs into the lungs every 4 (four) hours as needed for wheezing or shortness of breath. 06/09/18   Dionne BucySiadecki, Sebastian, MD  atorvastatin (LIPITOR) 40 MG tablet Take 40 mg by mouth daily.    [provider]  benztropine (COGENTIN) 1 MG tablet Take 1 mg by mouth at bedtime.    [provider]  divalproex (DEPAKOTE ER) 500 MG 24 hr tablet Take 1,000 mg by mouth at bedtime.    [provider]  haloperidol (HALDOL) 10 MG tablet Take 10 mg by mouth at bedtime.    [provider]  predniSONE (DELTASONE) 50 MG tablet Take 1 tablet (50 mg total) by mouth daily with breakfast. 01/18/18   Cuthriell, Delorise RoyalsJonathan D, PA-C  sulfamethoxazole-trimethoprim (BACTRIM DS,SEPTRA DS) 800-160 MG tablet Take 1 tablet by mouth 2 (two) times daily. 01/18/18   Cuthriell, Delorise RoyalsJonathan D, PA-C    Allergies Penicillins  History reviewed. No pertinent family history.  Social History Social History   Tobacco Use  . Smoking status: Current Every Day Smoker    Packs/day: 0.40    Types: Cigarettes  . Smokeless tobacco: Current User    Types: Chew  Substance Use Topics  . Alcohol use: Not Currently  . Drug use: Not Currently    Types: "Crack" cocaine, Methamphetamines    Review of Systems Constitutional: No fever/chills Eyes: No visual changes. ENT: No sore throat. Cardiovascular: Denies chest pain. Respiratory: Positive for cough. Gastrointestinal: No abdominal pain.  No nausea, no vomiting.  No diarrhea.   Genitourinary: Negative for dysuria. Musculoskeletal: Negative for back pain. Skin: Negative for rash. Neurological: Positive for syncopal episodes.   ____________________________________________   PHYSICAL EXAM:  VITAL SIGNS: ED Triage Vitals  Enc Vitals Group     BP 06/13/18 1757 (!) 152/86     Pulse Rate 06/13/18 1756 73  Resp 06/13/18 1756 20     Temp 06/13/18 1756 98.6 F (37 C)     Temp Source 06/13/18 1756 Oral     SpO2 06/13/18 1756 94 %     Weight 06/13/18 1755 180 lb (81.6 kg)     Height 06/13/18 1755 5\' 8"  (1.727 m)     Head Circumference --      Peak Flow --      Pain Score 06/13/18 1755 0   Constitutional: Alert and oriented.  Eyes: Conjunctivae are normal.  ENT      Head: Normocephalic and atraumatic.      Nose: No congestion/rhinnorhea.       Mouth/Throat: Mucous membranes are moist.      Neck: No stridor. Hematological/Lymphatic/Immunilogical: No cervical lymphadenopathy. Cardiovascular: Normal rate, regular rhythm.  No murmurs, rubs, or gallops.  Respiratory: Normal respiratory effort without tachypnea nor retractions. Breath sounds are clear and equal bilaterally. No wheezes/rales/rhonchi. Gastrointestinal: Soft and non tender. No rebound. No guarding.  Genitourinary: Deferred Musculoskeletal: Normal range of motion in all extremities. No lower extremity edema. Neurologic:  Normal speech and language. No gross focal neurologic deficits are appreciated.  Skin:  Skin is warm, dry and intact. No rash noted. Psychiatric: Mood and affect are normal. Speech and behavior are normal. Patient exhibits appropriate insight and judgment.  ____________________________________________    LABS (pertinent positives/negatives)  Trop <0.03 CBC wbc 19.4, hgb 14.7, plt 269 BMP wnl except na 134, glu 120  ____________________________________________   EKG  I, Phineas Semen, attending physician, personally viewed and interpreted this EKG  EKG Time: 1800 Rate: 65 Rhythm: normal sinus rhythm Axis: normal Intervals: qtc 393 QRS: narrow ST changes: no st elevation Impression: normal ekg  ____________________________________________    RADIOLOGY  CXR Atelectasis. COPD/ chronic bronchitis.  ____________________________________________   PROCEDURES  Procedures  ____________________________________________   INITIAL IMPRESSION / ASSESSMENT AND PLAN / ED COURSE  Pertinent labs & imaging results that were available during my care of the patient were reviewed by me and considered in my medical decision making (see chart for details).   Patient presented to the emergency department today because of concerns for continued cough as well as a few syncopal episodes during coughing fits.  On exam Per patient awake and alert.   Blood work without any concerning findings.  Does have a leukocytosis but I think this is likely secondary to steroid use.  Chest x-ray without any pneumonia.  Patient was given Jerilynn Som which he states made him feel better with less severe cough.  This point I think syncopal episodes likely secondary to significant tussive event.  Will plan on discharging with prescription for Tessalon Perles.  ____________________________________________   FINAL CLINICAL IMPRESSION(S) / ED DIAGNOSES  Final diagnoses:  Cough  Bronchitis     Note: This dictation was prepared with Dragon dictation. Any transcriptional errors that result from this process are unintentional     Phineas Semen, MD 06/13/18 2221

## 2018-06-14 NOTE — Progress Notes (Signed)
Assisted patient with the completion of the patient intake application for recertification.  Patient stated that he is working at [employer]  Patient to collect paystubs and checking account statement and bring to [facility].  Pateint understands that no additional medication assistance wiill be provided without requested financial documentation.  Patient also requested that I contact Laurette Schimke about helping him recertify with [facility].  I have contacted Laurette Schimke and she said that she would contact patient.  Sherilyn Dacosta Care Manager [facility]

## 2018-06-26 ENCOUNTER — Telehealth: Payer: Self-pay | Admitting: Pharmacist

## 2018-06-26 NOTE — Telephone Encounter (Signed)
06/26/2018 10:54:25 AM - Abilify Maintena 400mg  Vial  06/26/2018 Faxed [employer] application for enrollment on Abilify Maintena 400mg  Vial Inject intramuscular once a month. Per provider office last injection 05/23/2018 & next injection due 06/20/2018 with diagnosis code of F20.0. Forde Radon

## 2018-07-07 ENCOUNTER — Other Ambulatory Visit: Payer: Self-pay

## 2018-07-07 ENCOUNTER — Emergency Department: Payer: Self-pay

## 2018-07-07 ENCOUNTER — Encounter: Payer: Self-pay | Admitting: Radiology

## 2018-07-07 ENCOUNTER — Emergency Department
Admission: EM | Admit: 2018-07-07 | Discharge: 2018-07-07 | Disposition: A | Discharge status: Home / Self Care | Payer: Self-pay | Attending: Emergency Medicine | Admitting: Emergency Medicine

## 2018-07-07 DIAGNOSIS — F17228 Nicotine dependence, chewing tobacco, with other nicotine-induced disorders: Secondary | ICD-10-CM | POA: Insufficient documentation

## 2018-07-07 DIAGNOSIS — F17210 Nicotine dependence, cigarettes, uncomplicated: Secondary | ICD-10-CM | POA: Insufficient documentation

## 2018-07-07 DIAGNOSIS — B20 Human immunodeficiency virus [HIV] disease: Secondary | ICD-10-CM | POA: Insufficient documentation

## 2018-07-07 DIAGNOSIS — R059 Cough, unspecified: Secondary | ICD-10-CM

## 2018-07-07 DIAGNOSIS — R0602 Shortness of breath: Secondary | ICD-10-CM | POA: Insufficient documentation

## 2018-07-07 DIAGNOSIS — R05 Cough: Secondary | ICD-10-CM | POA: Insufficient documentation

## 2018-07-07 DIAGNOSIS — Z79899 Other long term (current) drug therapy: Secondary | ICD-10-CM | POA: Insufficient documentation

## 2018-07-07 LAB — CBC
HCT: 40.5 % (ref 39.0–52.0)
HEMOGLOBIN: 13.8 g/dL (ref 13.0–17.0)
MCH: 31.8 pg (ref 26.0–34.0)
MCHC: 34.1 g/dL (ref 30.0–36.0)
MCV: 93.3 fL (ref 80.0–100.0)
Platelets: 241 10*3/uL (ref 150–400)
RBC: 4.34 MIL/uL (ref 4.22–5.81)
RDW: 12.5 % (ref 11.5–15.5)
WBC: 6.1 10*3/uL (ref 4.0–10.5)
nRBC: 0 % (ref 0.0–0.2)

## 2018-07-07 LAB — BASIC METABOLIC PANEL
Anion gap: 11 (ref 5–15)
BUN: 14 mg/dL (ref 6–20)
CALCIUM: 9 mg/dL (ref 8.9–10.3)
CO2: 24 mmol/L (ref 22–32)
Chloride: 103 mmol/L (ref 98–111)
Creatinine, Ser: 1.21 mg/dL (ref 0.61–1.24)
GFR calc Af Amer: >60 mL/min (ref >60)
GFR calc non Af Amer: >60 mL/min (ref >60)
Glucose, Bld: 121 mg/dL — ABNORMAL HIGH (ref 70–99)
Potassium: 3.5 mmol/L (ref 3.5–5.1)
SODIUM: 138 mmol/L (ref 135–145)

## 2018-07-07 LAB — URINALYSIS, COMPLETE (UACMP) WITH MICROSCOPIC
Bacteria, UA: NONE SEEN
Bilirubin Urine: NEGATIVE
GLUCOSE, UA: NEGATIVE mg/dL
Hgb urine dipstick: NEGATIVE
KETONES UR: NEGATIVE mg/dL
Leukocytes,Ua: NEGATIVE
Nitrite: NEGATIVE
Protein, ur: NEGATIVE mg/dL
Specific Gravity, Urine: 1.018 (ref 1.005–1.030)
Squamous Epithelial / HPF: NONE SEEN (ref 0–5)
pH: 6 (ref 5.0–8.0)

## 2018-07-07 MED ORDER — LIDOCAINE HCL 4 % EX SOLN
Freq: Once | CUTANEOUS | Status: AC
  Administered 2018-07-07: 5 mL via TOPICAL
  Filled 2018-07-07: qty 50

## 2018-07-07 MED ORDER — AZITHROMYCIN 250 MG PO TABS: ORAL_TABLET | ORAL | 0 refills | Status: DC

## 2018-07-07 MED ORDER — IOHEXOL 350 MG/ML SOLN
75.0000 mL | Freq: Once | INTRAVENOUS | Status: AC | PRN
  Administered 2018-07-07: 75 mL via INTRAVENOUS

## 2018-07-07 NOTE — Discharge Instructions (Signed)
As we discussed, your work-up was reassuring today.  Some infectious disease tests are currently pending including pertussis and your HIV labs.  Please follow-up with your regular infectious disease doctor as soon as possible.  I also included information about a pulmonologist with whom you can schedule a visit.  Please take the course of antibiotics as prescribed.  Return to the emergency department if you develop new or worsening symptoms that concern you.

## 2018-07-07 NOTE — ED Notes (Signed)
Pt states that he has had a horrible cough for the past two months. Pt is also coughing up a clear mucus. PT also has SOB from coughing and states that he passed out twice because of it. Family member at bedside.

## 2018-07-07 NOTE — ED Provider Notes (Signed)
[facility] Emergency Department Provider Note  ____________________________________________   First MD Initiated Contact with Patient 07/07/18 [phone]     (approximate)  I have reviewed the triage vital signs and the nursing notes.   HISTORY  Chief Complaint Cough and Shortness of Breath    HPI Cody Craig. is a 48 y.o. male previously diagnosed as HIV positive and who says he goes to [place] [facility] services for his medical care including his antiretroviral medications (he claims that he is compliant).  He presents for evaluation of 2 weeks of persistent coughing that is so severe that sometimes he passes out.  He reports that it has happened multiple times within the last 24 hours.  This is his fifth visit within the [facility] health care system in 6 months and multiple of the visits were for the same issue.   He has not followed up with any outpatient doctor but he states that he was not told he needed to.  He has a visit coming up in a couple of weeks with his infectious disease doctor for repeat lab work.  He says that his last CD4 count was "good" and his viral level was "undetectable" as of almost 6 months ago.  He denies fever/chills, nausea, vomiting, and abdominal pain.  He coughs so hard that he passes out.  He is short of breath with the coughing.  He says he is sleep deprived because he cannot get any rest.  Nothing in particular makes his symptoms better or worse and he states "all they do is give me the little yellow pills and they do not help".  He describes the symptoms as severe  He reports that he has spent time in prison and that when he was in prison years ago he was diagnosed with whooping cough.  No known history of tuberculosis.     Past Medical History:  Diagnosis Date  . HIV (human immunodeficiency virus infection) (HCC)     There are no active problems to display for this patient.   No past surgical history on file.  Prior to  Admission medications   Medication Sig Start Date End Date Taking? Authorizing Provider  albuterol (PROVENTIL HFA;VENTOLIN HFA) 108 (90 Base) MCG/ACT inhaler Inhale 2 puffs into the lungs every 4 (four) hours as needed for wheezing or shortness of breath. 01/18/18   Cuthriell, Delorise Royals, PA-C  albuterol (PROVENTIL HFA;VENTOLIN HFA) 108 (90 Base) MCG/ACT inhaler Inhale 2 puffs into the lungs every 4 (four) hours as needed for wheezing or shortness of breath. 06/09/18   Dionne Bucy, MD  atorvastatin (LIPITOR) 40 MG tablet Take 40 mg by mouth daily.    [provider]  azithromycin (ZITHROMAX) 250 MG tablet Take 2 tablets PO on day 1, then take 1 tablet PO daily for 4 more days 07/07/18   Loleta Rose, MD  benzonatate (TESSALON PERLES) 100 MG capsule Take 1 capsule (100 mg total) by mouth every 6 (six) hours as needed for cough. 06/13/18 06/13/19  Phineas Semen, MD  benztropine (COGENTIN) 1 MG tablet Take 1 mg by mouth at bedtime.    [provider]  divalproex (DEPAKOTE ER) 500 MG 24 hr tablet Take 1,000 mg by mouth at bedtime.    [provider]  haloperidol (HALDOL) 10 MG tablet Take 10 mg by mouth at bedtime.    [provider]  predniSONE (DELTASONE) 50 MG tablet Take 1 tablet (50 mg total) by mouth daily with breakfast. 01/18/18  Cuthriell, Delorise RoyalsJonathan D, PA-C  sulfamethoxazole-trimethoprim (BACTRIM DS,SEPTRA DS) 800-160 MG tablet Take 1 tablet by mouth 2 (two) times daily. 01/18/18   Cuthriell, Delorise RoyalsJonathan D, PA-C    Allergies Penicillins  No family history on file.  Social History Social History   Tobacco Use  . Smoking status: Current Every Day Smoker    Packs/day: 0.40    Types: Cigarettes  . Smokeless tobacco: Current User    Types: Chew  Substance Use Topics  . Alcohol use: Not Currently  . Drug use: Not Currently    Types: "Crack" cocaine, Methamphetamines    Review of Systems Constitutional: No fever/chills Eyes: No visual  changes. ENT: No sore throat. Cardiovascular: Coughs so hard that he passes out denies chest pain. Respiratory: Chronic and severe cough that causes syncopal episodes.  Difficulty breathing only when coughing. Gastrointestinal: No abdominal pain.  No nausea, no vomiting.  No diarrhea.  No constipation. Genitourinary: Negative for dysuria. Musculoskeletal: Negative for neck pain.  Negative for back pain. Integumentary: Negative for rash. Neurological: Negative for headaches, focal weakness or numbness.   ____________________________________________   PHYSICAL EXAM:  VITAL SIGNS: ED Triage Vitals  Enc Vitals Group     BP 07/07/18 0050 129/81     Pulse Rate 07/07/18 0050 66     Resp 07/07/18 0050 19     Temp 07/07/18 0050 98.3 F (36.8 C)     Temp src --      SpO2 07/07/18 0050 95 %     Weight --      Height --      Head Circumference --      Peak Flow --      Pain Score 07/07/18 0040 0     Pain Loc --      Pain Edu? --      Excl. in GC? --     Constitutional: Alert and oriented. Well appearing and in no acute distress. Eyes: Conjunctivae are normal.  Head: Atraumatic. Nose: No congestion/rhinnorhea. Mouth/Throat: Mucous membranes are moist. Neck: No stridor.  No meningeal signs.   Cardiovascular: Normal rate, regular rhythm. Good peripheral circulation. Grossly normal heart sounds. Respiratory: Normal respiratory effort.  No retractions. Lungs CTAB.  The patient had multiple episodes of severe and violent paroxysms of coughing where he almost seemed to stop breathing at the end of them and states "I almost passed out".  He turns purple and is gasping and seems to be almost choking during the episodes. Gastrointestinal: Soft and nontender. No distention.  Musculoskeletal: Muscular body habitus.  No lower extremity tenderness nor edema. No gross deformities of extremities. Neurologic:  Normal speech and language. No gross focal neurologic deficits are appreciated.  Skin:   Skin is warm, dry and intact. No rash noted. Psychiatric: Mood and affect are a little bit odd and pressured but generally normal under the circumstances.  ____________________________________________   LABS (all labs ordered are listed, but only abnormal results are displayed)  Labs Reviewed  BASIC METABOLIC PANEL - Abnormal; Notable for the following components:      Result Value   Glucose, Bld 121 (*)    All other components within normal limits  URINALYSIS, COMPLETE (UACMP) WITH MICROSCOPIC - Abnormal; Notable for the following components:   Color, Urine YELLOW (*)    APPearance CLEAR (*)    All other components within normal limits  BORDETELLA PERTUSSIS PCR  CBC  T-HELPER CELLS CD4/CD8 %  HIV-1 RNA QUANT-NO REFLEX-BLD   ____________________________________________  EKG  ED  ECG REPORT I, Loleta Rose, the attending physician, personally viewed and interpreted this ECG.  Date: 07/07/2018 EKG Time: 00: 49 Rate: 71 Rhythm: normal sinus rhythm QRS Axis: normal Intervals: Low voltage QRS, otherwise unremarkable ST/T Wave abnormalities: Non-specific ST segment / T-wave changes, but no clear evidence of acute ischemia. Narrative Interpretation: no definitive evidence of acute ischemia; does not meet STEMI criteria.   ____________________________________________  RADIOLOGY Prior chest x-rays have been normal.  I have ordered a CTA chest with IV contrast which is currently pending.   ____________________________________________   PROCEDURES   Procedure(s) performed (including Critical Care):  Procedures   ____________________________________________   INITIAL IMPRESSION / MDM / ASSESSMENT AND PLAN / ED COURSE  As part of my medical decision making, I reviewed the following data within the electronic MEDICAL RECORD NUMBER Nursing notes reviewed and incorporated, Labs reviewed , Old chart reviewed and Notes from prior ED visits       Differential diagnosis  includes, but is not limited to, pertussis, inhaled foreign body, interstitial lung disease, less likely tuberculosis.  PCP pneumonia is also possible but unlikely given that he has had multiple normal chest x-rays.  The paroxysms of coughing I witnessed are very impressive and severe but he breathes easily and comfortably in between the episodes and has clear lung sounds.  He is a tobacco user.  Given his obvious frustration about the persistent symptoms and the negative work-up that has happened thus far, I am sending a pertussis sample and obtaining a CTA chest with IV contrast to further evaluate the lung parenchyma as well as to make sure he is not having pulmonary emboli which are causing the syncopal episodes even though I do not think that they would be causing cough.  Tuberculosis is possible but he has no lesions on x-ray and if the CT scan also does not have any concerning lesions I do not think it would be beneficial to send AFB smears or cultures at this time.  He has an infectious disease doctor with whom he is supposed to follow-up within a couple of weeks.  I will also give him information about pulmonology.  Unless he has an emergent finding on his CTA chest I do not think he will require admission.  I have also sent off HIV labs including helper T-cell counts and viral load but these results will not be back immediately as pertussis will not either.  He understands and agrees with the plan  For treatment purposes, I have ordered nebulized lidocaine 4% (5 mL equals 200 mg) to see if this helps him with the spasms.  Clinical Course as of Feb 29 0923  Sat Jul 07, 2018  1610 Patient is resting comfortably.  He says he feels completely better after the lidocaine nebulizer treatment and no longer has the urge to cough.  I told him that his CTA chest is normal and that he needs to follow-up with his infectious disease doctor and I am also giving him information about pulmonology with whom he can  follow.  Given the possibility of pertussis, I will treat him empirically with a course of azithromycin to decrease infectivity.  I explained to them that will not cure the disease process if he does not fact have pertussis.  The lab work is pending.  He already has infectious disease follow-up scheduled.  I gave my usual customary return precautions and the patient understands and agrees with the plan.   [CF]    Clinical Course User  Index [CF] Loleta Rose, MD    ____________________________________________  FINAL CLINICAL IMPRESSION(S) / ED DIAGNOSES  Final diagnoses:  Cough     MEDICATIONS GIVEN DURING THIS VISIT:  Medications  lidocaine (XYLOCAINE) 4 % external solution (5 mLs Topical Given 07/07/18 0655)  iohexol (OMNIPAQUE) 350 MG/ML injection 75 mL (75 mLs Intravenous Contrast Given 07/07/18 0622)     ED Discharge Orders         Ordered    azithromycin (ZITHROMAX) 250 MG tablet     07/07/18 9407           Note:  This document was prepared using Dragon voice recognition software and may include unintentional dictation errors.   Loleta Rose, MD 07/07/18 [phone]

## 2018-07-07 NOTE — ED Triage Notes (Signed)
Patient reports cough for several weeks, reports short of breath and "passing out".

## 2018-07-08 LAB — T-HELPER CELLS CD4/CD8 %
% CD 4 Pos. Lymph.: 45.5 % (ref 30.8–58.5)
Absolute CD 4 Helper: 774 /uL (ref 359–1519)
Basophils Absolute: 0 10*3/uL (ref 0.0–0.2)
Basos: 1 %
CD3+CD4+ Cells/CD3+CD8+ Cells Bld: 1.47 (ref 0.92–3.72)
CD3+CD8+ Cells # Bld: 527 /uL (ref 109–897)
CD3+CD8+ Cells NFr Bld: 31 % (ref 12.0–35.5)
EOS (ABSOLUTE): 0.1 10*3/uL (ref 0.0–0.4)
EOS: 2 %
HEMOGLOBIN: 13.3 g/dL (ref 13.0–17.7)
Hematocrit: 38.3 % (ref 37.5–51.0)
Immature Grans (Abs): 0 10*3/uL (ref 0.0–0.1)
Immature Granulocytes: 0 %
Lymphocytes Absolute: 1.7 10*3/uL (ref 0.7–3.1)
Lymphs: 30 %
MCH: 32.5 pg (ref 26.6–33.0)
MCHC: 34.7 g/dL (ref 31.5–35.7)
MCV: 94 fL (ref 79–97)
Monocytes Absolute: 0.7 10*3/uL (ref 0.1–0.9)
Monocytes: 13 %
Neutrophils Absolute: 3 10*3/uL (ref 1.4–7.0)
Neutrophils: 54 %
PLATELETS: 248 10*3/uL (ref 150–450)
RBC: 4.09 x10E6/uL — ABNORMAL LOW (ref 4.14–5.80)
RDW: 12.9 % (ref 11.6–15.4)
WBC: 5.6 10*3/uL (ref 3.4–10.8)

## 2018-07-08 LAB — HIV-1 RNA QUANT-NO REFLEX-BLD
HIV 1 RNA QUANT: 170 {copies}/mL
LOG10 HIV-1 RNA: 2.23 log10copy/mL

## 2018-07-11 ENCOUNTER — Telehealth: Payer: Self-pay | Admitting: Pharmacy Technician

## 2018-07-11 LAB — BORDETELLA PERTUSSIS PCR
B parapertussis, DNA: NEGATIVE
B pertussis, DNA: NEGATIVE

## 2018-07-11 NOTE — Telephone Encounter (Signed)
Received 2020 proof of income.  Patient eligible to receive medication assistance at [facility] for medications not provided by [facility].  Sherilyn Dacosta Care Manager [facility]

## 2018-09-03 ENCOUNTER — Ambulatory Visit: Payer: Medicaid Other | Admitting: Pharmacist

## 2018-09-03 ENCOUNTER — Other Ambulatory Visit: Payer: Self-pay

## 2018-09-03 DIAGNOSIS — Z79899 Other long term (current) drug therapy: Secondary | ICD-10-CM

## 2018-09-03 NOTE — Progress Notes (Signed)
[facility] Visit Note  Patient: Cody Craig. MRN: [id number] Date of Birth: Aug 06, 1970 PCP: Cody Craig, Craig   Cody Craig. 48 y.o. male, was contacted via phone today for his annual medication therapy management appointment.  Patient was identified by name and date of birth.  There were no vitals taken for this visit.  Patient Information   Past Medical History:  Diagnosis Date  . HIV (human immunodeficiency virus infection) (HCC)      No past surgical history on file.  No family history on file.  New Diagnoses (since last visit):   Family Support: Good            Social History   Substance and Sexual Activity  Alcohol Use Not Currently      Social History   Tobacco Use  Smoking Status Current Every Day Smoker  . Packs/day: 0.40  . Types: Cigarettes  Smokeless Tobacco Current User  . Types: Chew      Health Maintenance  Topic Date Due  . TETANUS/TDAP  06/05/1989  . INFLUENZA VACCINE  12/08/2018  . HIV Screening  Completed   Outpatient Encounter Medications as of 09/03/2018  Medication Sig  . albuterol (PROVENTIL HFA;VENTOLIN HFA) 108 (90 Base) MCG/ACT inhaler Inhale 2 puffs into the lungs every 4 (four) hours as needed for wheezing or shortness of breath.  . ARIPiprazole ER (ABILIFY MAINTENA) 400 MG PRSY prefilled syringe Inject 400 mg into the muscle every 28 (twenty-eight) days.  Marland Kitchen atorvastatin (LIPITOR) 40 MG tablet Take 40 mg by mouth daily.  . bictegravir-emtricitabine-tenofovir AF (BIKTARVY) 50-200-25 MG TABS tablet Take 1 tablet by mouth daily.  . budesonide-formoterol (SYMBICORT) 160-4.5 MCG/ACT inhaler Inhale 2 puffs into the lungs 2 (two) times daily.  . cetirizine (ZYRTEC) 10 MG tablet Take 10 mg by mouth daily.  . divalproex (DEPAKOTE ER) 500 MG 24 hr tablet Take 500 mg by mouth at bedtime.   . haloperidol (HALDOL) 10 MG tablet Take 5 mg by mouth at bedtime.   Cody Craig (INGREZZA) 80 MG  CAPS Take 1 capsule by mouth daily.  . [DISCONTINUED] albuterol (PROVENTIL HFA;VENTOLIN HFA) 108 (90 Base) MCG/ACT inhaler Inhale 2 puffs into the lungs every 4 (four) hours as needed for wheezing or shortness of breath.  . [DISCONTINUED] azithromycin (ZITHROMAX) 250 MG tablet Take 2 tablets PO on day 1, then take 1 tablet PO daily for 4 more days  . [DISCONTINUED] benzonatate (TESSALON PERLES) 100 MG capsule Take 1 capsule (100 mg total) by mouth every 6 (six) hours as needed for cough.  . [DISCONTINUED] benztropine (COGENTIN) 1 MG tablet Take 1 mg by mouth at bedtime.  . [DISCONTINUED] predniSONE (DELTASONE) 50 MG tablet Take 1 tablet (50 mg total) by mouth daily with breakfast.  . [DISCONTINUED] sulfamethoxazole-trimethoprim (BACTRIM DS,SEPTRA DS) 800-160 MG tablet Take 1 tablet by mouth 2 (two) times daily.   No facility-administered encounter medications on file as of 09/03/2018.     ASSESSMENT:  Medication Compliance and Access: Patient states he is taking all of his medications daily. He now uses a pill box for organization. He was able to confirm each medication as they were mentioned. He was able to confirm which medications have been discontinued.  [facility] is providing assistance with the Symbicort, Proventil, Abilify Maintena, divalproex, and haloperidol. We are in the process of ordering the Ingrezza from the manufacturer's assistance program. The atorvastatin and Biktarvy are provided by [facility].  Dyslipidemia: Taking atorvastatin, no issues reported. No  labs available to review. Currently seeing a provider [facility] location.  Mental Health: Care provided by [facility] Currently taking divalproex, haloperidol, Abilify Maintena, and Ingrezza. States that he is doing well on this combination. He appears to be compliant with his regimen. Occasionally experiences drowsiness, but this is not really an issue for him.  Respiratory  Issues: Currently using Symbicort, albuterol MDI as needed and cetirizine.   Smoking Cessation: Initially, Cody Craig was not interested in smoking cessation due to the cost of NRT's. Discussed the [employer] [place] program and options available. Will send the brochure to the patient to review.  PLAN: Return to clinic in 1 year, sooner if needed      Cody Blyden K. Joelene MillinHarrison, BS, PharmD [facility]-Pharmacy Operations Coordinator [phone]

## 2019-08-24 ENCOUNTER — Emergency Department: Payer: Medicaid Other

## 2019-08-24 ENCOUNTER — Other Ambulatory Visit: Payer: Self-pay

## 2019-08-24 ENCOUNTER — Emergency Department: Payer: Self-pay

## 2019-08-24 ENCOUNTER — Emergency Department
Admission: EM | Admit: 2019-08-24 | Discharge: 2019-08-24 | Disposition: A | Discharge status: Home / Self Care | Payer: Self-pay | Attending: Emergency Medicine | Admitting: Emergency Medicine

## 2019-08-24 DIAGNOSIS — S50811A Abrasion of right forearm, initial encounter: Secondary | ICD-10-CM | POA: Insufficient documentation

## 2019-08-24 DIAGNOSIS — S59911A Unspecified injury of right forearm, initial encounter: Secondary | ICD-10-CM | POA: Diagnosis present

## 2019-08-24 DIAGNOSIS — Z23 Encounter for immunization: Secondary | ICD-10-CM | POA: Diagnosis not present

## 2019-08-24 DIAGNOSIS — S301XXA Contusion of abdominal wall, initial encounter: Secondary | ICD-10-CM | POA: Diagnosis not present

## 2019-08-24 DIAGNOSIS — Y92410 Unspecified street and highway as the place of occurrence of the external cause: Secondary | ICD-10-CM | POA: Diagnosis not present

## 2019-08-24 DIAGNOSIS — S0081XA Abrasion of other part of head, initial encounter: Secondary | ICD-10-CM | POA: Insufficient documentation

## 2019-08-24 DIAGNOSIS — M25512 Pain in left shoulder: Secondary | ICD-10-CM | POA: Diagnosis not present

## 2019-08-24 DIAGNOSIS — S50812A Abrasion of left forearm, initial encounter: Secondary | ICD-10-CM | POA: Insufficient documentation

## 2019-08-24 DIAGNOSIS — M542 Cervicalgia: Secondary | ICD-10-CM | POA: Diagnosis not present

## 2019-08-24 DIAGNOSIS — Y939 Activity, unspecified: Secondary | ICD-10-CM | POA: Insufficient documentation

## 2019-08-24 DIAGNOSIS — S0990XA Unspecified injury of head, initial encounter: Secondary | ICD-10-CM | POA: Insufficient documentation

## 2019-08-24 DIAGNOSIS — F17210 Nicotine dependence, cigarettes, uncomplicated: Secondary | ICD-10-CM | POA: Insufficient documentation

## 2019-08-24 DIAGNOSIS — Z21 Asymptomatic human immunodeficiency virus [HIV] infection status: Secondary | ICD-10-CM | POA: Diagnosis not present

## 2019-08-24 DIAGNOSIS — Y999 Unspecified external cause status: Secondary | ICD-10-CM | POA: Insufficient documentation

## 2019-08-24 DIAGNOSIS — Z79899 Other long term (current) drug therapy: Secondary | ICD-10-CM | POA: Insufficient documentation

## 2019-08-24 DIAGNOSIS — Z8673 Personal history of transient ischemic attack (TIA), and cerebral infarction without residual deficits: Secondary | ICD-10-CM | POA: Insufficient documentation

## 2019-08-24 DIAGNOSIS — Z20822 Contact with and (suspected) exposure to covid-19: Secondary | ICD-10-CM | POA: Insufficient documentation

## 2019-08-24 DIAGNOSIS — S0001XA Abrasion of scalp, initial encounter: Secondary | ICD-10-CM | POA: Diagnosis not present

## 2019-08-24 DIAGNOSIS — Z96619 Presence of unspecified artificial shoulder joint: Secondary | ICD-10-CM | POA: Insufficient documentation

## 2019-08-24 HISTORY — DX: Cerebral infarction, unspecified: I63.9

## 2019-08-24 LAB — COMPREHENSIVE METABOLIC PANEL
ALT: 17 U/L (ref 0–44)
AST: 36 U/L (ref 15–41)
Albumin: 4.5 g/dL (ref 3.5–5.0)
Alkaline Phosphatase: 95 U/L (ref 38–126)
Anion gap: 9 (ref 5–15)
BUN: 7 mg/dL (ref 6–20)
CO2: 25 mmol/L (ref 22–32)
Calcium: 9.3 mg/dL (ref 8.9–10.3)
Chloride: 102 mmol/L (ref 98–111)
Creatinine, Ser: 1.02 mg/dL (ref 0.61–1.24)
GFR calc Af Amer: >60 mL/min (ref >60)
GFR calc non Af Amer: >60 mL/min (ref >60)
Glucose, Bld: 101 mg/dL — ABNORMAL HIGH (ref 70–99)
Potassium: 3.1 mmol/L — ABNORMAL LOW (ref 3.5–5.1)
Sodium: 136 mmol/L (ref 135–145)
Total Bilirubin: 0.6 mg/dL (ref 0.3–1.2)
Total Protein: 7.9 g/dL (ref 6.5–8.1)

## 2019-08-24 LAB — RESPIRATORY PANEL BY RT PCR (FLU A&B, COVID)
Influenza A by PCR: NEGATIVE
Influenza B by PCR: NEGATIVE
SARS Coronavirus 2 by RT PCR: NEGATIVE

## 2019-08-24 LAB — CBC
HCT: 43.7 % (ref 39.0–52.0)
Hemoglobin: 15.9 g/dL (ref 13.0–17.0)
MCH: 32.6 pg (ref 26.0–34.0)
MCHC: 36.4 g/dL — ABNORMAL HIGH (ref 30.0–36.0)
MCV: 89.5 fL (ref 80.0–100.0)
Platelets: 294 10*3/uL (ref 150–400)
RBC: 4.88 MIL/uL (ref 4.22–5.81)
RDW: 12.2 % (ref 11.5–15.5)
WBC: 12.9 10*3/uL — ABNORMAL HIGH (ref 4.0–10.5)
nRBC: 0 % (ref 0.0–0.2)

## 2019-08-24 LAB — ETHANOL: Alcohol, Ethyl (B): <10 mg/dL (ref <10)

## 2019-08-24 LAB — TYPE AND SCREEN
ABO/RH(D): A NEG
Antibody Screen: NEGATIVE

## 2019-08-24 LAB — PROTIME-INR
INR: 1 (ref 0.8–1.2)
Prothrombin Time: 12.9 seconds (ref 11.4–15.2)

## 2019-08-24 LAB — LACTIC ACID, PLASMA: Lactic Acid, Venous: 1.6 mmol/L (ref 0.5–1.9)

## 2019-08-24 MED ORDER — SODIUM CHLORIDE 0.9 % IV BOLUS
1000.0000 mL | Freq: Once | INTRAVENOUS | Status: AC
  Administered 2019-08-24: 1000 mL via INTRAVENOUS

## 2019-08-24 MED ORDER — HYDROCODONE-ACETAMINOPHEN 5-325 MG PO TABS: 1.0000 | ORAL_TABLET | Freq: Four times a day (QID) | ORAL | Status: DC | PRN

## 2019-08-24 MED ORDER — HYDROCODONE-ACETAMINOPHEN 5-325 MG PO TABS: 1.0000 | ORAL_TABLET | Freq: Four times a day (QID) | ORAL | 0 refills | Status: DC | PRN

## 2019-08-24 MED ORDER — IOHEXOL 300 MG/ML  SOLN
100.0000 mL | Freq: Once | INTRAMUSCULAR | Status: AC | PRN
  Administered 2019-08-24: 22:00:00 100 mL via INTRAVENOUS

## 2019-08-24 MED ORDER — MORPHINE SULFATE (PF) 4 MG/ML IV SOLN
4.0000 mg | Freq: Once | INTRAVENOUS | Status: AC
  Administered 2019-08-24: 21:00:00 4 mg via INTRAVENOUS
  Filled 2019-08-24: qty 1

## 2019-08-24 MED ORDER — ONDANSETRON HCL 4 MG/2ML IJ SOLN
4.0000 mg | Freq: Once | INTRAMUSCULAR | Status: AC
  Administered 2019-08-24: 21:00:00 4 mg via INTRAVENOUS
  Filled 2019-08-24: qty 2

## 2019-08-24 MED ORDER — HYDROMORPHONE HCL 1 MG/ML IJ SOLN
1.0000 mg | Freq: Once | INTRAMUSCULAR | Status: AC
  Administered 2019-08-24: 1 mg via INTRAVENOUS
  Filled 2019-08-24: qty 1

## 2019-08-24 MED ORDER — TETANUS-DIPHTH-ACELL PERTUSSIS 5-2.5-18.5 LF-MCG/0.5 IM SUSP
0.5000 mL | Freq: Once | INTRAMUSCULAR | Status: AC
  Administered 2019-08-24: 21:00:00 0.5 mL via INTRAMUSCULAR
  Filled 2019-08-24: qty 0.5
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
## 2019-08-24 NOTE — Discharge Instructions (Signed)
Take motrin for pain   Take vicodin for severe pain   Expect to be stiff and sore for several days   See your doctor  Return to ER if you have worse back pain, neck pain, headaches, vomiting
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
## 2019-09-25 ENCOUNTER — Other Ambulatory Visit: Payer: Self-pay

## 2019-09-25 ENCOUNTER — Ambulatory Visit: Payer: Medicaid Other | Admitting: Pharmacist

## 2019-09-25 DIAGNOSIS — Z79899 Other long term (current) drug therapy: Secondary | ICD-10-CM
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
## 2019-10-02 ENCOUNTER — Other Ambulatory Visit: Payer: Self-pay

## 2019-11-29 DIAGNOSIS — Z96619 Presence of unspecified artificial shoulder joint: Secondary | ICD-10-CM | POA: Insufficient documentation

## 2019-11-29 DIAGNOSIS — Z7951 Long term (current) use of inhaled steroids: Secondary | ICD-10-CM | POA: Insufficient documentation

## 2019-11-29 DIAGNOSIS — R109 Unspecified abdominal pain: Secondary | ICD-10-CM | POA: Insufficient documentation

## 2019-11-29 DIAGNOSIS — J45909 Unspecified asthma, uncomplicated: Secondary | ICD-10-CM | POA: Insufficient documentation

## 2019-11-29 DIAGNOSIS — R197 Diarrhea, unspecified: Secondary | ICD-10-CM | POA: Insufficient documentation

## 2019-11-29 DIAGNOSIS — Z8673 Personal history of transient ischemic attack (TIA), and cerebral infarction without residual deficits: Secondary | ICD-10-CM | POA: Insufficient documentation

## 2019-11-29 DIAGNOSIS — R112 Nausea with vomiting, unspecified: Secondary | ICD-10-CM | POA: Insufficient documentation

## 2019-11-29 DIAGNOSIS — Z21 Asymptomatic human immunodeficiency virus [HIV] infection status: Secondary | ICD-10-CM | POA: Insufficient documentation

## 2019-11-29 DIAGNOSIS — F17210 Nicotine dependence, cigarettes, uncomplicated: Secondary | ICD-10-CM | POA: Insufficient documentation

## 2019-11-30 ENCOUNTER — Emergency Department
Admission: EM | Admit: 2019-11-30 | Discharge: 2019-11-30 | Disposition: A | Discharge status: Home / Self Care | Payer: Medicaid Other | Attending: Emergency Medicine | Admitting: Emergency Medicine

## 2019-11-30 ENCOUNTER — Other Ambulatory Visit: Payer: Self-pay

## 2019-11-30 DIAGNOSIS — R109 Unspecified abdominal pain: Secondary | ICD-10-CM

## 2019-11-30 DIAGNOSIS — R112 Nausea with vomiting, unspecified: Secondary | ICD-10-CM

## 2019-11-30 DIAGNOSIS — R197 Diarrhea, unspecified: Secondary | ICD-10-CM

## 2019-11-30 LAB — COMPREHENSIVE METABOLIC PANEL
ALT: 13 U/L (ref 0–44)
AST: 19 U/L (ref 15–41)
Albumin: 4.1 g/dL (ref 3.5–5.0)
Alkaline Phosphatase: 87 U/L (ref 38–126)
Anion gap: 10 (ref 5–15)
BUN: 15 mg/dL (ref 6–20)
CO2: 25 mmol/L (ref 22–32)
Calcium: 9.1 mg/dL (ref 8.9–10.3)
Chloride: 104 mmol/L (ref 98–111)
Creatinine, Ser: 1.24 mg/dL (ref 0.61–1.24)
GFR calc Af Amer: >60 mL/min (ref >60)
GFR calc non Af Amer: >60 mL/min (ref >60)
Glucose, Bld: 100 mg/dL — ABNORMAL HIGH (ref 70–99)
Potassium: 3.8 mmol/L (ref 3.5–5.1)
Sodium: 139 mmol/L (ref 135–145)
Total Bilirubin: 0.8 mg/dL (ref 0.3–1.2)
Total Protein: 7.3 g/dL (ref 6.5–8.1)

## 2019-11-30 LAB — URINALYSIS, COMPLETE (UACMP) WITH MICROSCOPIC
Bacteria, UA: NONE SEEN
Bilirubin Urine: NEGATIVE
Glucose, UA: NEGATIVE mg/dL
Hgb urine dipstick: NEGATIVE
Ketones, ur: NEGATIVE mg/dL
Leukocytes,Ua: NEGATIVE
Nitrite: NEGATIVE
Protein, ur: NEGATIVE mg/dL
Specific Gravity, Urine: 1.024 (ref 1.005–1.030)
Squamous Epithelial / HPF: NONE SEEN (ref 0–5)
pH: 6 (ref 5.0–8.0)

## 2019-11-30 LAB — CBC
HCT: 40.8 % (ref 39.0–52.0)
Hemoglobin: 14.3 g/dL (ref 13.0–17.0)
MCH: 32.2 pg (ref 26.0–34.0)
MCHC: 35 g/dL (ref 30.0–36.0)
MCV: 91.9 fL (ref 80.0–100.0)
Platelets: 237 10*3/uL (ref 150–400)
RBC: 4.44 MIL/uL (ref 4.22–5.81)
RDW: 12.1 % (ref 11.5–15.5)
WBC: 7.2 10*3/uL (ref 4.0–10.5)
nRBC: 0 % (ref 0.0–0.2)

## 2019-11-30 LAB — LIPASE, BLOOD: Lipase: 39 U/L (ref 11–51)

## 2019-11-30 MED ORDER — ONDANSETRON 4 MG PO TBDP: ORAL_TABLET | ORAL | 0 refills | Status: DC

## 2019-11-30 MED ORDER — SODIUM CHLORIDE 0.9% FLUSH: 3.0000 mL | Freq: Once | INTRAVENOUS | Status: DC

## 2019-11-30 NOTE — ED Notes (Signed)
Reviewed discharge instructions, follow-up care, and prescriptions with patient. Patient verbalized understanding of all information reviewed. Patient stable, with no distress noted at this time.    Meal tray and beverage given per patient request.

## 2019-11-30 NOTE — ED Notes (Signed)
ED Provider at bedside. 

## 2019-11-30 NOTE — ED Provider Notes (Signed)
[facility] Emergency Department Provider Note  ____________________________________________   First MD Initiated Contact with Patient 11/30/19 [phone]     (approximate)  I have reviewed the triage vital signs and the nursing notes.   HISTORY  Chief Complaint Abdominal Pain    HPI Cody Craig. is a 49 y.o. male with medical history as listed below which notably includes HIV for which he says he is compliant with his medications.  He presents for evaluation of about 2 days of intermittent episodes of nausea, vomiting, and diarrhea.  For a while he also had some pain in the right lower part of his abdomen but that has completely resolved.  He describes the pain is both cramping and occasionally sharp.   He thinks that his symptoms started after eating sushi at [facility] a couple of days ago.  He denies fever, sore throat, chest pain, shortness of breath.  Nothing in particular makes his symptoms better or worse and he describes them as severe.        Past Medical History:  Diagnosis Date  . Asthma   . HIV (human immunodeficiency virus infection) (HCC)   . Hyperlipemia   . Stroke [facility])     There are no problems to display for this patient.   Past Surgical History:  Procedure Laterality Date  . JOINT REPLACEMENT     shoulder    Prior to Admission medications   Medication Sig Start Date End Date Taking? Authorizing Provider  albuterol (PROVENTIL HFA;VENTOLIN HFA) 108 (90 Base) MCG/ACT inhaler Inhale 2 puffs into the lungs every 4 (four) hours as needed for wheezing or shortness of breath. 06/09/18   Dionne Bucy, MD  atorvastatin (LIPITOR) 40 MG tablet Take 20 mg by mouth daily.     [provider]  bictegravir-emtricitabine-tenofovir AF (BIKTARVY) 50-200-25 MG TABS tablet Take 1 tablet by mouth daily.    [provider]  budesonide-formoterol (SYMBICORT) 160-4.5 MCG/ACT inhaler Inhale 2 puffs into the lungs 2 (two) times daily.     Evie Lacks, NP  divalproex (DEPAKOTE ER) 500 MG 24 hr tablet Take 500 mg by mouth in the morning and at bedtime.     Ahluwalia, Shamsher S, MD  haloperidol (HALDOL) 10 MG tablet Take 5 mg by mouth at bedtime.     Ahluwalia, Shamsher S, MD  loratadine (CLARITIN) 10 MG tablet Take 10 mg by mouth in the morning and at bedtime.    [provider]  ondansetron (ZOFRAN ODT) 4 MG disintegrating tablet Allow 1-2 tablets to dissolve in your mouth every 8 hours as needed for nausea/vomiting 11/30/19   Loleta Rose, MD  Valbenazine Tosylate [facility]) 80 MG CAPS Take 1 capsule by mouth daily.    Katheren Puller, MD    Allergies Penicillins  No family history on file.  Social History Social History   Tobacco Use  . Smoking status: Current Every Day Smoker    Packs/day: 0.40    Types: Cigarettes  . Smokeless tobacco: Current User    Types: Chew  . Tobacco comment: Mailed Quitline [place] brochure to pt. 09-04-18  Vaping Use  . Vaping Use: Unknown  Substance Use Topics  . Alcohol use: Not Currently  . Drug use: Not Currently    Types: "Crack" cocaine, Methamphetamines    Review of Systems Constitutional: No fever/chills Eyes: No visual changes. ENT: No sore throat. Cardiovascular: Denies chest pain. Respiratory: Denies shortness of breath. Gastrointestinal: Nausea, vomiting, and diarrhea.  Some abdominal pain, now  resolved. Genitourinary: Negative for dysuria. Musculoskeletal: Negative for neck pain.  Negative for back pain. Integumentary: Negative for rash. Neurological: Negative for headaches, focal weakness or numbness.   ____________________________________________   PHYSICAL EXAM:  VITAL SIGNS: ED Triage Vitals  Enc Vitals Group     BP 11/30/19 0005 127/81     Pulse Rate 11/30/19 0005 82     Resp 11/30/19 0005 17     Temp 11/30/19 0005 98.9 F (37.2 C)     Temp Source 11/30/19 0005 Oral     SpO2 11/30/19 0005 98 %     Weight 11/30/19 0006 78 kg (172  lb)     Height 11/30/19 0006 1.727 m (5\' 8" )     Head Circumference --      Peak Flow --      Pain Score 11/30/19 0017 8     Pain Loc --      Pain Edu? --      Excl. in GC? --     Constitutional: Alert and oriented.  Patient was sleeping when I checked on him and is in no acute distress after waking him up. Eyes: Conjunctivae are normal.  Head: Atraumatic. Nose: No congestion/rhinnorhea. Mouth/Throat: Patient is wearing a mask. Neck: No stridor.  No meningeal signs.   Cardiovascular: Normal rate, regular rhythm. Good peripheral circulation. Grossly normal heart sounds. Respiratory: Normal respiratory effort.  No retractions. Gastrointestinal: Soft and nontender. No distention.  He indicates the right lower quadrant as the area that hurt earlier but he has absolutely no tenderness to palpation, even with deep palpation.  No rebound and no guarding. Musculoskeletal: No lower extremity tenderness nor edema. No gross deformities of extremities. Neurologic:  Normal speech and language. No gross focal neurologic deficits are appreciated.  Skin:  Skin is warm, dry and intact. Psychiatric: Mood and affect are normal. Speech and behavior are normal.  ____________________________________________   LABS (all labs ordered are listed, but only abnormal results are displayed)  Labs Reviewed  COMPREHENSIVE METABOLIC PANEL - Abnormal; Notable for the following components:      Result Value   Glucose, Bld 100 (*)    All other components within normal limits  URINALYSIS, COMPLETE (UACMP) WITH MICROSCOPIC - Abnormal; Notable for the following components:   Color, Urine YELLOW (*)    APPearance CLEAR (*)    All other components within normal limits  LIPASE, BLOOD  CBC   ____________________________________________  EKG  No indication for emergent EKG ____________________________________________  RADIOLOGY I, 12/02/19, personally viewed and evaluated these images (plain radiographs)  as part of my medical decision making, as well as reviewing the written report by the radiologist.  ED MD interpretation: No indication for emergent imaging  Official radiology report(s): No results found.  ____________________________________________   PROCEDURES   Procedure(s) performed (including Critical Care):  Procedures   ____________________________________________   INITIAL IMPRESSION / MDM / ASSESSMENT AND PLAN / ED COURSE  As part of my medical decision making, I reviewed the following data within the electronic MEDICAL RECORD NUMBER Nursing notes reviewed and incorporated, Labs reviewed , Old chart reviewed and Notes from prior ED visits   Differential diagnosis includes, but is not limited to, viral gastroenteritis, bacterial gastroenteritis, food poisoning, appendicitis, diverticulitis.  Patient's vital signs are stable.  He is afebrile and not tachycardic.  His urinalysis was clear, lipase normal, CBC is normal with no leukocytosis, and CMP is normal.  In spite of about 2 days of symptoms, on exam, he has  absolutely no tenderness to palpation of the abdomen which is very reassuring.  Additionally he has not had any episodes of vomiting or diarrhea since coming to the ED almost 7 hours ago.  I provided the reassuring information that I suspect he is suffering from a mild case of viral gastroenteritis and he should improve soon.  I gave my usual and customary management recommendations and return precautions.  He understands and agrees with the plan.  He asked for four or five days out of work, but I assured him he should feel better in a day or two, particularly given that his symptoms seem to have already resolved.         ____________________________________________  FINAL CLINICAL IMPRESSION(S) / ED DIAGNOSES  Final diagnoses:  Nausea vomiting and diarrhea  Right sided abdominal pain     MEDICATIONS GIVEN DURING THIS VISIT:  Medications  sodium chloride  flush (NS) 0.9 % injection 3 mL (3 mLs Intravenous Not Given 11/30/19 0455)     ED Discharge Orders         Ordered    ondansetron (ZOFRAN ODT) 4 MG disintegrating tablet     Discontinue  Reprint     11/30/19 7494          *Please note:  Cody Craig. was evaluated in Emergency Department on 11/30/2019 for the symptoms described in the history of present illness. He was evaluated in the context of the global COVID-19 pandemic, which necessitated consideration that the patient might be at risk for infection with the SARS-CoV-2 virus that causes COVID-19. Institutional protocols and algorithms that pertain to the evaluation of patients at risk for COVID-19 are in a state of rapid change based on information released by regulatory bodies including the CDC and federal and state organizations. These policies and algorithms were followed during the patient's care in the ED.  Some ED evaluations and interventions may be delayed as a result of limited staffing during and after the pandemic.*  Note:  This document was prepared using Dragon voice recognition software and may include unintentional dictation errors.   Loleta Rose, MD 11/30/19 [phone]

## 2019-11-30 NOTE — Discharge Instructions (Addendum)

## 2019-11-30 NOTE — ED Triage Notes (Signed)
Pt to the er for abd pain, diarrhea, vomiting. Pt ate sushi 3 days ago and then it started. Pt states he ate the sushi at [facility]. Pt states it is more vomiting now.

## 2019-12-04 ENCOUNTER — Encounter: Payer: Self-pay | Admitting: Emergency Medicine

## 2019-12-04 ENCOUNTER — Other Ambulatory Visit: Payer: Self-pay

## 2019-12-04 DIAGNOSIS — J45909 Unspecified asthma, uncomplicated: Secondary | ICD-10-CM | POA: Insufficient documentation

## 2019-12-04 DIAGNOSIS — Z7951 Long term (current) use of inhaled steroids: Secondary | ICD-10-CM | POA: Insufficient documentation

## 2019-12-04 DIAGNOSIS — F17210 Nicotine dependence, cigarettes, uncomplicated: Secondary | ICD-10-CM | POA: Insufficient documentation

## 2019-12-04 DIAGNOSIS — R197 Diarrhea, unspecified: Secondary | ICD-10-CM | POA: Insufficient documentation

## 2019-12-04 LAB — COMPREHENSIVE METABOLIC PANEL
ALT: 17 U/L (ref 0–44)
AST: 23 U/L (ref 15–41)
Albumin: 4.2 g/dL (ref 3.5–5.0)
Alkaline Phosphatase: 79 U/L (ref 38–126)
Anion gap: 11 (ref 5–15)
BUN: 13 mg/dL (ref 6–20)
CO2: 23 mmol/L (ref 22–32)
Calcium: 9.1 mg/dL (ref 8.9–10.3)
Chloride: 104 mmol/L (ref 98–111)
Creatinine, Ser: 1.2 mg/dL (ref 0.61–1.24)
GFR calc Af Amer: >60 mL/min (ref >60)
GFR calc non Af Amer: >60 mL/min (ref >60)
Glucose, Bld: 120 mg/dL — ABNORMAL HIGH (ref 70–99)
Potassium: 4 mmol/L (ref 3.5–5.1)
Sodium: 138 mmol/L (ref 135–145)
Total Bilirubin: 0.7 mg/dL (ref 0.3–1.2)
Total Protein: 7.2 g/dL (ref 6.5–8.1)

## 2019-12-04 LAB — CBC WITH DIFFERENTIAL/PLATELET
Abs Immature Granulocytes: 0.02 10*3/uL (ref 0.00–0.07)
Basophils Absolute: 0.1 10*3/uL (ref 0.0–0.1)
Basophils Relative: 1 %
Eosinophils Absolute: 0.2 10*3/uL (ref 0.0–0.5)
Eosinophils Relative: 2 %
HCT: 39.6 % (ref 39.0–52.0)
Hemoglobin: 14.1 g/dL (ref 13.0–17.0)
Immature Granulocytes: 0 %
Lymphocytes Relative: 32 %
Lymphs Abs: 2.2 10*3/uL (ref 0.7–4.0)
MCH: 32.7 pg (ref 26.0–34.0)
MCHC: 35.6 g/dL (ref 30.0–36.0)
MCV: 91.9 fL (ref 80.0–100.0)
Monocytes Absolute: 0.8 10*3/uL (ref 0.1–1.0)
Monocytes Relative: 11 %
Neutro Abs: 3.8 10*3/uL (ref 1.7–7.7)
Neutrophils Relative %: 54 %
Platelets: 240 10*3/uL (ref 150–400)
RBC: 4.31 MIL/uL (ref 4.22–5.81)
RDW: 12.2 % (ref 11.5–15.5)
WBC: 7 10*3/uL (ref 4.0–10.5)
nRBC: 0 % (ref 0.0–0.2)

## 2019-12-04 LAB — LIPASE, BLOOD: Lipase: 47 U/L (ref 11–51)

## 2019-12-04 NOTE — ED Triage Notes (Signed)
Patient ambulatory to triage with steady gait, without difficulty or distress noted; pt reports diarrhea with rt lower abd pain; seen for same recently but st no dx

## 2019-12-05 ENCOUNTER — Emergency Department: Payer: Self-pay

## 2019-12-05 ENCOUNTER — Emergency Department
Admission: EM | Admit: 2019-12-05 | Discharge: 2019-12-05 | Disposition: A | Discharge status: Home / Self Care | Payer: Self-pay | Attending: Emergency Medicine | Admitting: Emergency Medicine

## 2019-12-05 DIAGNOSIS — R197 Diarrhea, unspecified: Secondary | ICD-10-CM

## 2019-12-05 DIAGNOSIS — R109 Unspecified abdominal pain: Secondary | ICD-10-CM

## 2019-12-05 LAB — URINALYSIS, COMPLETE (UACMP) WITH MICROSCOPIC
Bacteria, UA: NONE SEEN
Bilirubin Urine: NEGATIVE
Glucose, UA: NEGATIVE mg/dL
Hgb urine dipstick: NEGATIVE
Ketones, ur: NEGATIVE mg/dL
Leukocytes,Ua: NEGATIVE
Nitrite: NEGATIVE
Protein, ur: NEGATIVE mg/dL
Specific Gravity, Urine: 1.019 (ref 1.005–1.030)
pH: 6 (ref 5.0–8.0)

## 2019-12-05 MED ORDER — IOHEXOL 9 MG/ML PO SOLN
500.0000 mL | Freq: Once | ORAL | Status: AC | PRN
  Administered 2019-12-05 (×2): 500 mL via ORAL

## 2019-12-05 MED ORDER — METRONIDAZOLE 500 MG PO TABS
500.0000 mg | ORAL_TABLET | Freq: Three times a day (TID) | ORAL | 0 refills | Status: DC
Start: 2019-12-05 — End: 2019-12-12

## 2019-12-05 MED ORDER — IOHEXOL 300 MG/ML  SOLN
100.0000 mL | Freq: Once | INTRAMUSCULAR | Status: AC | PRN
  Administered 2019-12-05: 100 mL via INTRAVENOUS

## 2019-12-05 MED ORDER — CIPROFLOXACIN HCL 500 MG PO TABS
500.0000 mg | ORAL_TABLET | Freq: Two times a day (BID) | ORAL | 0 refills | Status: DC
Start: 2019-12-05 — End: 2019-12-12

## 2019-12-05 NOTE — ED Provider Notes (Signed)
[facility] Emergency Department Provider Note   ____________________________________________   First MD Initiated Contact with Patient 12/05/19 [phone]     (approximate)  I have reviewed the triage vital signs and the nursing notes.   HISTORY  Chief Complaint Diarrhea    HPI Timo Hartwig. is a 49 y.o. male who complains of right lower quadrant pain which is severe at times worse with palpation percussion and movement.  Walking makes it hurt worse.  He says he is having 3 or 4 episodes of diarrhea an hour and is been going on for several days.  Patient says in 2017 he had appendicitis on CT scan and was treated for it with amoxicillin or some other antibiotic.  Patient's also has a history of stroke and HIV disease.  Notes from [facility] say it is well controlled.        Past Medical History:  Diagnosis Date  . Asthma   . HIV (human immunodeficiency virus infection) (HCC)   . Hyperlipemia   . Stroke [facility])     There are no problems to display for this patient.   Past Surgical History:  Procedure Laterality Date  . JOINT REPLACEMENT     shoulder    Prior to Admission medications   Medication Sig Start Date End Date Taking? Authorizing Provider  albuterol (PROVENTIL HFA;VENTOLIN HFA) 108 (90 Base) MCG/ACT inhaler Inhale 2 puffs into the lungs every 4 (four) hours as needed for wheezing or shortness of breath. 06/09/18   Dionne Bucy, MD  atorvastatin (LIPITOR) 40 MG tablet Take 20 mg by mouth daily.     [provider]  bictegravir-emtricitabine-tenofovir AF (BIKTARVY) 50-200-25 MG TABS tablet Take 1 tablet by mouth daily.    [provider]  budesonide-formoterol (SYMBICORT) 160-4.5 MCG/ACT inhaler Inhale 2 puffs into the lungs 2 (two) times daily.    Evie Lacks, NP  ciprofloxacin (CIPRO) 500 MG tablet Take 1 tablet (500 mg total) by mouth 2 (two) times daily for 10 days. 12/05/19 12/15/19  Arnaldo Natal, MD  divalproex  (DEPAKOTE ER) 500 MG 24 hr tablet Take 500 mg by mouth in the morning and at bedtime.     Ahluwalia, Shamsher S, MD  haloperidol (HALDOL) 10 MG tablet Take 5 mg by mouth at bedtime.     Ahluwalia, Shamsher S, MD  loratadine (CLARITIN) 10 MG tablet Take 10 mg by mouth in the morning and at bedtime.    [provider]  metroNIDAZOLE (FLAGYL) 500 MG tablet Take 1 tablet (500 mg total) by mouth 3 (three) times daily for 7 days. 12/05/19 12/12/19  Arnaldo Natal, MD  ondansetron (ZOFRAN ODT) 4 MG disintegrating tablet Allow 1-2 tablets to dissolve in your mouth every 8 hours as needed for nausea/vomiting 11/30/19   Loleta Rose, MD  Valbenazine Tosylate [facility]) 80 MG CAPS Take 1 capsule by mouth daily.    Katheren Puller, MD    Allergies Penicillins  No family history on file.  Social History Social History   Tobacco Use  . Smoking status: Current Every Day Smoker    Packs/day: 0.40    Types: Cigarettes  . Smokeless tobacco: Current User    Types: Chew  . Tobacco comment: Mailed Quitline [place] brochure to pt. 09-04-18  Vaping Use  . Vaping Use: Unknown  Substance Use Topics  . Alcohol use: Not Currently  . Drug use: Not Currently    Types: "Crack" cocaine, Methamphetamines    Review of Systems  Constitutional: No fever/chills Eyes: No visual changes. ENT: No sore throat. Cardiovascular: Denies chest pain. Respiratory: Denies shortness of breath. Gastrointestinal:  abdominal pain.   nausea, no vomiting.  diarrhea.  No constipation. Genitourinary: Negative for dysuria. Musculoskeletal: Negative for back pain. Skin: Negative for rash. Neurological: Negative for new headaches or focal weakness    ____________________________________________   PHYSICAL EXAM:  VITAL SIGNS: ED Triage Vitals [12/04/19 2307]  Enc Vitals Group     BP 120/77     Pulse Rate 80     Resp 18     Temp 99.3 F (37.4 C)     Temp Source Oral     SpO2 97 %     Weight 172 lb (78 kg)      Height 5\' 8"  (1.727 m)     Head Circumference      Peak Flow      Pain Score 9     Pain Loc      Pain Edu?      Excl. in GC?     Constitutional: Alert and oriented. Well appearing and in no acute distress. Eyes: Conjunctivae are normal.  Head: Atraumatic. Nose: No congestion/rhinnorhea. Mouth/Throat: Mucous membranes are moist.  Oropharynx non-erythematous. Neck: No stridor.   Cardiovascular: Normal rate, regular rhythm. Grossly normal heart sounds.  Good peripheral circulation. Respiratory: Normal respiratory effort.  No retractions. Lungs CTAB. Gastrointestinal: Soft tender to palpation percussion right lower quadrant. No distention. No abdominal bruits. No CVA tenderness. Musculoskeletal: No lower extremity tenderness nor edema.   Neurologic:  Normal speech and language. No gross focal neurologic deficits are appreciated.  Skin:  Skin is warm, dry and intact. No rash noted.   ____________________________________________   LABS (all labs ordered are listed, but only abnormal results are displayed)  Labs Reviewed  COMPREHENSIVE METABOLIC PANEL - Abnormal; Notable for the following components:      Result Value   Glucose, Bld 120 (*)    All other components within normal limits  URINALYSIS, COMPLETE (UACMP) WITH MICROSCOPIC - Abnormal; Notable for the following components:   Color, Urine YELLOW (*)    APPearance CLEAR (*)    All other components within normal limits  GASTROINTESTINAL PANEL BY PCR, STOOL (REPLACES STOOL CULTURE)  C DIFFICILE QUICK SCREEN W PCR REFLEX  CBC WITH DIFFERENTIAL/PLATELET  LIPASE, BLOOD   ____________________________________________  EKG   ____________________________________________  RADIOLOGY  ED MD interpretatio CT read by radiology reviewed by me does not show any appendicitis or other problems.  Official radiology report(s): CT ABDOMEN PELVIS W CONTRAST  Result Date: 12/05/2019 CLINICAL DATA:  Right lower quadrant pain with  appendicitis suspected EXAM: CT ABDOMEN AND PELVIS WITH CONTRAST TECHNIQUE: Multidetector CT imaging of the abdomen and pelvis was performed using the standard protocol following bolus administration of intravenous contrast. CONTRAST:  12/07/2019 OMNIPAQUE IOHEXOL 300 MG/ML  SOLN COMPARISON:  08/24/2019 FINDINGS: Lower chest:  Mild atelectatic type density in the right lower lobe. Hepatobiliary: No focal liver abnormality.No evidence of biliary obstruction or stone. Pancreas: Unremarkable. Spleen: Unremarkable. Adrenals/Urinary Tract: Negative adrenals. No hydronephrosis or stone. Unremarkable bladder. Stomach/Bowel:  No obstruction. No appendicitis. Vascular/Lymphatic: No acute vascular abnormality. Mild atherosclerotic calcification along the aorta. No mass or adenopathy. Reproductive:No pathologic findings. Other: No ascites or pneumoperitoneum. Musculoskeletal: No acute abnormalities. Mild lower lumbar disc narrowings. IMPRESSION: No acute finding, including appendicitis. Electronically Signed   By: 08/26/2019 M.D.   On: 12/05/2019 06:52    ____________________________________________   PROCEDURES  Procedure(s) performed (including  Critical Care):  Procedures   ____________________________________________   INITIAL IMPRESSION / ASSESSMENT AND PLAN / ED COURSE Patient seen yesterday for abdominal pain and diarrhea.  Returns today saying pain is worse and the pain is in the right lower quadrant.  Diarrhea continues.  Because of the right lower quadrant pain on exam and pain he complains having pain walking we will get a CT of his abdomen even though his white count is normal.  White count can be normal and appendicitis.  This may not be what is going on with him but because of the intensity of his pain and believe it is worth a look.     ----------------------------------------- 7:26 AM on 12/05/2019 -----------------------------------------  CT is negative.  He has not produced a stool  specimen.  I will let him go with some antibiotics and see says he has normally diarrhea 3 or 4 times in an hour.  I will have him return if he is worse at all so we can get a stool specimen.  We will have him follow-up with his doctor as well.         ____________________________________________   FINAL CLINICAL IMPRESSION(S) / ED DIAGNOSES  Final diagnoses:  Diarrhea, unspecified type  Abdominal pain, unspecified abdominal location     ED Discharge Orders         Ordered    ciprofloxacin (CIPRO) 500 MG tablet  2 times daily     Discontinue  Reprint     12/05/19 0724    metroNIDAZOLE (FLAGYL) 500 MG tablet  3 times daily     Discontinue  Reprint     12/05/19 0724           Note:  This document was prepared using Dragon voice recognition software and may include unintentional dictation errors.    Arnaldo Natal, MD 12/05/19 [phone]

## 2019-12-05 NOTE — Discharge Instructions (Addendum)
The CT scan was negative.  It did not show any appendicitis.  For the diarrhea please try some Pepto-Bismol.  If that does not work after 3 doses start the Cipro twice a day and the Flagyl antibiotics.  They should help.  Return here for increasing diarrhea, fever, worse pain or vomiting.  Also return if you get any rash.  Remember that occasionally the Cipro can cause tendons to rupture so if you get any pain in your arms or legs stop the Cipro and return here immediately.

## 2019-12-05 NOTE — ED Notes (Signed)
Patient reports here for right lower quad pain not relieved with otc meds. Ct completed. Patient has been discharged to home with follow up instructions.

## 2019-12-12 ENCOUNTER — Other Ambulatory Visit: Payer: Self-pay

## 2019-12-12 ENCOUNTER — Emergency Department: Payer: Self-pay

## 2019-12-12 ENCOUNTER — Emergency Department
Admission: EM | Admit: 2019-12-12 | Discharge: 2019-12-12 | Disposition: A | Discharge status: Home / Self Care | Payer: Self-pay | Attending: Student in an Organized Health Care Education/Training Program | Admitting: Student in an Organized Health Care Education/Training Program

## 2019-12-12 ENCOUNTER — Encounter: Payer: Self-pay | Admitting: Emergency Medicine

## 2019-12-12 DIAGNOSIS — Z7951 Long term (current) use of inhaled steroids: Secondary | ICD-10-CM | POA: Insufficient documentation

## 2019-12-12 DIAGNOSIS — Y9389 Activity, other specified: Secondary | ICD-10-CM | POA: Insufficient documentation

## 2019-12-12 DIAGNOSIS — F17210 Nicotine dependence, cigarettes, uncomplicated: Secondary | ICD-10-CM | POA: Insufficient documentation

## 2019-12-12 DIAGNOSIS — Y929 Unspecified place or not applicable: Secondary | ICD-10-CM | POA: Insufficient documentation

## 2019-12-12 DIAGNOSIS — Y9289 Other specified places as the place of occurrence of the external cause: Secondary | ICD-10-CM | POA: Insufficient documentation

## 2019-12-12 DIAGNOSIS — S40012A Contusion of left shoulder, initial encounter: Secondary | ICD-10-CM | POA: Insufficient documentation

## 2019-12-12 DIAGNOSIS — W208XXA Other cause of strike by thrown, projected or falling object, initial encounter: Secondary | ICD-10-CM | POA: Insufficient documentation

## 2019-12-12 DIAGNOSIS — J45909 Unspecified asthma, uncomplicated: Secondary | ICD-10-CM | POA: Insufficient documentation

## 2019-12-12 DIAGNOSIS — Z96619 Presence of unspecified artificial shoulder joint: Secondary | ICD-10-CM | POA: Insufficient documentation

## 2019-12-12 DIAGNOSIS — Y999 Unspecified external cause status: Secondary | ICD-10-CM | POA: Insufficient documentation

## 2019-12-12 MED ORDER — MELOXICAM 7.5 MG PO TABS
7.5000 mg | ORAL_TABLET | Freq: Every day | ORAL | 0 refills | Status: DC
Start: 2019-12-12 — End: 2020-01-16
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
## 2019-12-12 NOTE — ED Notes (Signed)
Patient unable to e-sign discharge instructions due to computer problems. Patient verbalized understanding of discharge instructions including getting prescription from medication management and following up with PCP and ortho.

## 2019-12-12 NOTE — Discharge Instructions (Signed)
Ice and elevation to your shoulder as needed for pain and swelling.  Most likely you will be sore for 4 to 5 days even with medication.  A prescription for meloxicam was sent to the pharmacy for you to begin taking once a day for the next week.  Make sure that you have eaten before taking this medication.
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
## 2019-12-19 ENCOUNTER — Telehealth: Payer: Self-pay | Admitting: Pharmacy Technician

## 2019-12-19 NOTE — Telephone Encounter (Signed)
Patient failed to provide 2021 financial information.  Patient is unable to receive medication assistance until requested financial documentation has been provided.  Patient requested [facility] send his prescriptions to [facility].  Attempted to contact patient by phone.  Unable to reach.  Left message.  Also, sent a referral to Laurette Schimke to coordinate [employer] benefits with patient.  Sherilyn Dacosta Care Manager [facility]

## 2020-01-04 ENCOUNTER — Other Ambulatory Visit: Payer: Self-pay

## 2020-01-04 ENCOUNTER — Emergency Department
Admission: EM | Admit: 2020-01-04 | Discharge: 2020-01-04 | Disposition: A | Discharge status: Home / Self Care | Payer: Medicaid Other | Attending: Emergency Medicine | Admitting: Emergency Medicine

## 2020-01-04 DIAGNOSIS — Z79899 Other long term (current) drug therapy: Secondary | ICD-10-CM | POA: Insufficient documentation

## 2020-01-04 DIAGNOSIS — B20 Human immunodeficiency virus [HIV] disease: Secondary | ICD-10-CM | POA: Insufficient documentation

## 2020-01-04 DIAGNOSIS — F17210 Nicotine dependence, cigarettes, uncomplicated: Secondary | ICD-10-CM | POA: Insufficient documentation

## 2020-01-04 DIAGNOSIS — J45909 Unspecified asthma, uncomplicated: Secondary | ICD-10-CM | POA: Insufficient documentation

## 2020-01-04 DIAGNOSIS — B349 Viral infection, unspecified: Secondary | ICD-10-CM

## 2020-01-04 DIAGNOSIS — R110 Nausea: Secondary | ICD-10-CM | POA: Insufficient documentation

## 2020-01-04 DIAGNOSIS — R519 Headache, unspecified: Secondary | ICD-10-CM | POA: Insufficient documentation

## 2020-01-04 MED ORDER — ONDANSETRON 4 MG PO TBDP
4.0000 mg | ORAL_TABLET | Freq: Three times a day (TID) | ORAL | 0 refills | Status: AC | PRN
Start: 2020-01-04 — End: ?

## 2020-01-04 MED ORDER — ONDANSETRON 4 MG PO TBDP
4.0000 mg | ORAL_TABLET | Freq: Once | ORAL | Status: AC
  Administered 2020-01-04: 4 mg via ORAL

## 2020-01-04 MED ORDER — ONDANSETRON 4 MG PO TBDP
ORAL_TABLET | ORAL | Status: AC
  Filled 2020-01-04: qty 1

## 2020-01-04 NOTE — ED Notes (Signed)
See triage note  Presents with body aches and nausea  States he received the 2nd COVID shot 4 days ago   And has felt bad since low grade temp on arrival

## 2020-01-04 NOTE — Discharge Instructions (Addendum)
Increase fluids to stay hydrated.  Zofran if needed for nausea or vomiting.  Tylenol or ibuprofen if needed for body aches, headache or fever.  Continue taking your regular medication.  Follow-up with your primary care provider if any continued problems on Monday.  Return to work on your regular work day Tuesday.

## 2020-01-04 NOTE — ED Provider Notes (Signed)
[facility] Emergency Department Provider Note   ____________________________________________   First MD Initiated Contact with Patient 01/04/20 [phone]     (approximate)  I have reviewed the triage vital signs and the nursing notes.   HISTORY  Chief Complaint Nasal Congestion and Headache   HPI Cody Craig. is a 49 y.o. male presents to the ED with complaint of body aches, nausea and low-grade temp 4 days after receiving his second Covid vaccine.  Patient states that he got the Mederma injection on Monday.  He reports no symptoms after his first injection.  Patient was given Zofran while in the ED and began feeling better.     Past Medical History:  Diagnosis Date  . Asthma   . HIV (human immunodeficiency virus infection) (HCC)   . Hyperlipemia   . Stroke [facility])     There are no problems to display for this patient.   Past Surgical History:  Procedure Laterality Date  . JOINT REPLACEMENT     shoulder    Prior to Admission medications   Medication Sig Start Date End Date Taking? Authorizing Provider  albuterol (PROVENTIL HFA;VENTOLIN HFA) 108 (90 Base) MCG/ACT inhaler Inhale 2 puffs into the lungs every 4 (four) hours as needed for wheezing or shortness of breath. 06/09/18   Dionne Bucy, MD  atorvastatin (LIPITOR) 40 MG tablet Take 20 mg by mouth daily.     [provider]  bictegravir-emtricitabine-tenofovir AF (BIKTARVY) 50-200-25 MG TABS tablet Take 1 tablet by mouth daily.    [provider]  budesonide-formoterol (SYMBICORT) 160-4.5 MCG/ACT inhaler Inhale 2 puffs into the lungs 2 (two) times daily.    Evie Lacks, NP  divalproex (DEPAKOTE ER) 500 MG 24 hr tablet Take 500 mg by mouth in the morning and at bedtime.     Ahluwalia, Shamsher S, MD  haloperidol (HALDOL) 10 MG tablet Take 5 mg by mouth at bedtime.     Ahluwalia, Shamsher S, MD  loratadine (CLARITIN) 10 MG tablet Take 10 mg by mouth in the morning and  at bedtime.    [provider]  meloxicam (MOBIC) 7.5 MG tablet Take 1 tablet (7.5 mg total) by mouth daily. 12/12/19 12/11/20  Tommi Rumps, PA-C  ondansetron (ZOFRAN ODT) 4 MG disintegrating tablet Take 1 tablet (4 mg total) by mouth every 8 (eight) hours as needed for nausea or vomiting. 01/04/20   Tommi Rumps, PA-C  Valbenazine Tosylate [facility]) 80 MG CAPS Take 1 capsule by mouth daily.    Katheren Puller, MD    Allergies Penicillins  History reviewed. No pertinent family history.  Social History Social History   Tobacco Use  . Smoking status: Current Every Day Smoker    Packs/day: 0.40    Types: Cigarettes  . Smokeless tobacco: Current User    Types: Chew  . Tobacco comment: Mailed Quitline  brochure to pt. 09-04-18  Vaping Use  . Vaping Use: Unknown  Substance Use Topics  . Alcohol use: Not Currently  . Drug use: Not Currently    Types: "Crack" cocaine, Methamphetamines    Review of Systems Constitutional: No fever/chills Eyes: No visual changes. ENT: No sore throat.  Positive rhinorrhea. Cardiovascular: Denies chest pain. Respiratory: Denies shortness of breath. Gastrointestinal: No abdominal pain.  Positive nausea, no vomiting.  No diarrhea.  No constipation. Genitourinary: Negative for dysuria. Musculoskeletal: Negative for back pain. Skin: Negative for rash. Neurological: Positive for headache, negative for focal weakness or numbness. ____________________________________________   PHYSICAL  EXAM:  VITAL SIGNS: ED Triage Vitals  Enc Vitals Group     BP 01/04/20 0726 (!) 128/99     Pulse Rate 01/04/20 0726 (!) 104     Resp 01/04/20 0726 18     Temp 01/04/20 0726 99.2 F (37.3 C)     Temp Source 01/04/20 0726 Oral     SpO2 01/04/20 0726 94 %     Weight 01/04/20 0727 172 lb (78 kg)     Height 01/04/20 0727 5\' 8"  (1.727 m)     Head Circumference --      Peak Flow --      Pain Score 01/04/20 0726 7     Pain Loc --      Pain Edu?  --      Excl. in GC? --     Constitutional: Alert and oriented. Well appearing and in no acute distress. Eyes: Conjunctivae are normal. PERRL. EOMI. Head: Atraumatic. Nose: Minimal congestion/rhinnorhea. Neck: No stridor.   Cardiovascular: Normal rate, regular rhythm. Grossly normal heart sounds.  Good peripheral circulation. Respiratory: Normal respiratory effort.  No retractions. Lungs CTAB. Gastrointestinal: Soft and nontender. No distention.  Bowel sounds normoactive x4 quadrants. Musculoskeletal: No lower extremity tenderness nor edema.  No joint effusions. Neurologic:  Normal speech and language. No gross focal neurologic deficits are appreciated. No gait instability. Skin:  Skin is warm, dry and intact. No rash noted. Psychiatric: Mood and affect are normal. Speech and behavior are normal.  ____________________________________________   LABS (all labs ordered are listed, but only abnormal results are displayed)  Labs Reviewed - No data to display  PROCEDURES  Procedure(s) performed (including Critical Care):  Procedures   ____________________________________________   INITIAL IMPRESSION / ASSESSMENT AND PLAN / ED COURSE  As part of my medical decision making, I reviewed the following data within the electronic MEDICAL RECORD NUMBER Notes from prior ED visits and [place] Controlled Substance Database  Cody Craig. was evaluated in Emergency Department on 01/04/2020 for the symptoms described in the history of present illness. He was evaluated in the context of the global COVID-19 pandemic, which necessitated consideration that the patient might be at risk for infection with the SARS-CoV-2 virus that causes COVID-19. Institutional protocols and algorithms that pertain to the evaluation of patients at risk for COVID-19 are in a state of rapid change based on information released by regulatory bodies including the CDC and federal and state organizations. These policies and  algorithms were followed during the patient's care in the ED.  49 year old male presents to the ED with complaint of headache, rhinorrhea, stomachache that began 4 days after his second maternal shot.  Patient was given Zofran while in the ED and improved greatly.  He states that he is ready to leave.  He was noted to be walking in the hallway without any difficulties.  Patient was encouraged to continue drinking fluids frequently, Tylenol if needed for fever and body aches and to follow-up with his PCP if any continued problems.  He is told to return to the emergency department if any severe worsening of his symptoms or difficulty breathing. ____________________________________________   FINAL CLINICAL IMPRESSION(S) / ED DIAGNOSES  Final diagnoses:  Viral illness     ED Discharge Orders         Ordered    ondansetron (ZOFRAN ODT) 4 MG disintegrating tablet  Every 8 hours PRN        01/04/20 0827  Note:  This document was prepared using Dragon voice recognition software and may include unintentional dictation errors.    Tommi Rumps, PA-C 01/04/20 1308    Delton Prairie, MD 01/04/20 [phone]

## 2020-01-04 NOTE — ED Triage Notes (Signed)
Pt states he got his second moderna shot on Monday and has been feeling sick since with headache, runny nose, and stomach ache

## 2020-01-16 ENCOUNTER — Emergency Department
Admission: EM | Admit: 2020-01-16 | Discharge: 2020-01-16 | Disposition: A | Discharge status: Home / Self Care | Payer: Medicaid Other | Attending: Emergency Medicine | Admitting: Emergency Medicine

## 2020-01-16 ENCOUNTER — Emergency Department: Payer: Medicaid Other

## 2020-01-16 ENCOUNTER — Encounter: Payer: Self-pay | Admitting: Emergency Medicine

## 2020-01-16 ENCOUNTER — Other Ambulatory Visit: Payer: Self-pay

## 2020-01-16 DIAGNOSIS — T148XXA Other injury of unspecified body region, initial encounter: Secondary | ICD-10-CM

## 2020-01-16 DIAGNOSIS — Y9389 Activity, other specified: Secondary | ICD-10-CM | POA: Insufficient documentation

## 2020-01-16 DIAGNOSIS — Y990 Civilian activity done for income or pay: Secondary | ICD-10-CM | POA: Insufficient documentation

## 2020-01-16 DIAGNOSIS — F17210 Nicotine dependence, cigarettes, uncomplicated: Secondary | ICD-10-CM | POA: Insufficient documentation

## 2020-01-16 DIAGNOSIS — S6722XA Crushing injury of left hand, initial encounter: Secondary | ICD-10-CM | POA: Insufficient documentation

## 2020-01-16 DIAGNOSIS — W231XXA Caught, crushed, jammed, or pinched between stationary objects, initial encounter: Secondary | ICD-10-CM | POA: Insufficient documentation

## 2020-01-16 DIAGNOSIS — J45909 Unspecified asthma, uncomplicated: Secondary | ICD-10-CM | POA: Insufficient documentation

## 2020-01-16 DIAGNOSIS — Z96619 Presence of unspecified artificial shoulder joint: Secondary | ICD-10-CM | POA: Insufficient documentation

## 2020-01-16 DIAGNOSIS — Z79899 Other long term (current) drug therapy: Secondary | ICD-10-CM | POA: Insufficient documentation

## 2020-01-16 DIAGNOSIS — Y9269 Other specified industrial and construction area as the place of occurrence of the external cause: Secondary | ICD-10-CM | POA: Insufficient documentation

## 2020-01-16 MED ORDER — MELOXICAM 15 MG PO TABS: 15.0000 mg | ORAL_TABLET | Freq: Every day | ORAL | 1 refills | Status: AC

## 2020-01-16 NOTE — ED Provider Notes (Signed)
Emergency Department Provider Note  ____________________________________________  Time seen: Approximately 4:03 PM  I have reviewed the triage vital signs and the nursing notes.   HISTORY  Chief Complaint Hand Pain   Historian Patient    HPI Cody Ly. is a 49 y.o. male presents to the emergency department after a contusion type injury.  Patient reports that a portion of a tree that he was picking up with machinery fell on the hand.  Patient states that he was able to pull his hand away.  He has noticed some swelling along left third digit.  Patient has had some numbness over left third digit.  No abrasions or lacerations.   Past Medical History:  Diagnosis Date   Asthma    HIV (human immunodeficiency virus infection) (HCC)    Hyperlipemia    Stroke (HCC)      Immunizations up to date:  Yes.     Past Medical History:  Diagnosis Date   Asthma    HIV (human immunodeficiency virus infection) (HCC)    Hyperlipemia    Stroke (HCC)     There are no problems to display for this patient.   Past Surgical History:  Procedure Laterality Date   JOINT REPLACEMENT     shoulder    Prior to Admission medications   Medication Sig Start Date End Date Taking? Authorizing Provider  albuterol (PROVENTIL HFA;VENTOLIN HFA) 108 (90 Base) MCG/ACT inhaler Inhale 2 puffs into the lungs every 4 (four) hours as needed for wheezing or shortness of breath. 06/09/18   Dionne Bucy, MD  atorvastatin (LIPITOR) 40 MG tablet Take 20 mg by mouth daily.     [provider]  bictegravir-emtricitabine-tenofovir AF (BIKTARVY) 50-200-25 MG TABS tablet Take 1 tablet by mouth daily.    [provider]  budesonide-formoterol (SYMBICORT) 160-4.5 MCG/ACT inhaler Inhale 2 puffs into the lungs 2 (two) times daily.    Evie Lacks, NP  divalproex (DEPAKOTE ER) 500 MG 24 hr tablet Take 500 mg by mouth in the morning and at bedtime.     Ahluwalia, Shamsher S, MD   haloperidol (HALDOL) 10 MG tablet Take 5 mg by mouth at bedtime.     Ahluwalia, Shamsher S, MD  loratadine (CLARITIN) 10 MG tablet Take 10 mg by mouth in the morning and at bedtime.    [provider]  meloxicam (MOBIC) 15 MG tablet Take 1 tablet (15 mg total) by mouth daily for 7 days. 01/16/20 01/23/20  Orvil Feil, PA-C  ondansetron (ZOFRAN ODT) 4 MG disintegrating tablet Take 1 tablet (4 mg total) by mouth every 8 (eight) hours as needed for nausea or vomiting. 01/04/20   Tommi Rumps, PA-C  Valbenazine Tosylate [facility]) 80 MG CAPS Take 1 capsule by mouth daily.    Katheren Puller, MD    Allergies Penicillins  No family history on file.  Social History Social History   Tobacco Use   Smoking status: Current Every Day Smoker    Packs/day: 0.40    Types: Cigarettes   Smokeless tobacco: Current User    Types: Chew   Tobacco comment: Mailed [employer] [place] brochure to pt. 09-04-18  Vaping Use   Vaping Use: Unknown  Substance Use Topics   Alcohol use: Not Currently   Drug use: Not Currently    Types: "Crack" cocaine, Methamphetamines     Review of Systems  Constitutional: No fever/chills Eyes:  No discharge ENT: No upper respiratory complaints. Respiratory: no cough. No SOB/ use of accessory  muscles to breath Gastrointestinal:   No nausea, no vomiting.  No diarrhea.  No constipation. Musculoskeletal: Patient has left hand pain.  Skin: Negative for rash, abrasions, lacerations, ecchymosis.    ____________________________________________   PHYSICAL EXAM:  VITAL SIGNS: ED Triage Vitals  Enc Vitals Group     BP 01/16/20 1459 139/81     Pulse Rate 01/16/20 1459 81     Resp 01/16/20 1459 17     Temp 01/16/20 1459 98.7 F (37.1 C)     Temp Source 01/16/20 1459 Oral     SpO2 01/16/20 1459 97 %     Weight 01/16/20 1457 172 lb (78 kg)     Height 01/16/20 1457 5\' 8"  (1.727 m)     Head Circumference --      Peak Flow --      Pain Score 01/16/20  1457 0     Pain Loc --      Pain Edu? --      Excl. in GC? --      Constitutional: Alert and oriented. Well appearing and in no acute distress. Eyes: Conjunctivae are normal. PERRL. EOMI. Head: Atraumatic. ENT: Cardiovascular: Normal rate, regular rhythm. Normal S1 and S2.  Good peripheral circulation. Respiratory: Normal respiratory effort without tachypnea or retractions. Lungs CTAB. Good air entry to the bases with no decreased or absent breath sounds Gastrointestinal: Bowel sounds x 4 quadrants. Soft and nontender to palpation. No guarding or rigidity. No distention. Musculoskeletal: Patient performs full range of motion at the left wrist.  No flex or extensor tendon deficits noted of the left hand.  Patient does have some soft tissue swelling along the course of the left middle finger.  Palpable radial pulse, left.  Capillary refill less than 2 seconds on the left. Neurologic:  Normal for age. No gross focal neurologic deficits are appreciated.  Skin:  Skin is warm, dry and intact. No rash noted. Psychiatric: Mood and affect are normal for age. Speech and behavior are normal.   ____________________________________________   LABS (all labs ordered are listed, but only abnormal results are displayed)  Labs Reviewed - No data to display ____________________________________________  EKG   ____________________________________________  RADIOLOGY 03/17/20, personally viewed and evaluated these images (plain radiographs) as part of my medical decision making, as well as reviewing the written report by the radiologist.  DG Hand 2 View Left  Result Date: 01/16/2020 CLINICAL DATA:  49 year old male status post crush injury to left hand at work. Middle finger pain is maximal. EXAM: LEFT HAND - 2 VIEW COMPARISON:  None. FINDINGS: 4th finger ring artifact. Bone mineralization is within normal limits. Healed chronic fracture of the 5th metacarpal. Distal radius and ulna appear  intact. Widening of the scapholunate interval. Otherwise the carpal bones appear intact. No acute metacarpal or phalanx fracture or dislocation. There is mild soft tissue swelling of the middle finger. IMPRESSION: 1. No acute fracture or dislocation identified in the left hand. 2. 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 Electronically Signed   By: [facility] M.D.   On: 01/16/2020 15:21    ____________________________________________    PROCEDURES  Procedure(s) performed:     Procedures     Medications - No data to display   ____________________________________________   INITIAL IMPRESSION / ASSESSMENT AND PLAN / ED COURSE  Pertinent labs & imaging results that were available during my care of the patient were reviewed by me and considered in my medical  decision making (see chart for details).      Assessment and plan Contusion type injury 49 year old male presents to the emergency department after a crush type injury.  Vital signs were reassuring at triage.  On physical exam, patient was alert and active.  No flexor or extensor tendon deficits appreciated with testing.  He was able to demonstrate full range of motion at the left wrist and left hand.  X-ray of the left hand revealed no bony abnormality.  I did place patient in a left middle finger splint.  I cautioned patient to return to the emergency department if he experiences worsening pain, swelling, coolness of the left fingers or worsening/numbness or tingling.  He voiced understanding of possible symptoms associated with compartment syndrome and stated that he had easy access to the emergency department should symptoms change.  He was discharged with daily meloxicam and advised to follow-up with hand specialist, Dr. Stephenie Acres.  Return precautions were given to return with new or worsening symptoms.   ____________________________________________  FINAL CLINICAL  IMPRESSION(S) / ED DIAGNOSES  Final diagnoses:  Crush injury      NEW MEDICATIONS STARTED DURING THIS VISIT:  ED Discharge Orders         Ordered    meloxicam (MOBIC) 15 MG tablet  Daily        01/16/20 1558              This chart was dictated using voice recognition software/Dragon. Despite best efforts to proofread, errors can occur which can change the meaning. Any change was purely unintentional.    Orvil Feil, PA-C 01/16/20 1612    Phineas Semen, MD 01/16/20 [phone]

## 2020-01-16 NOTE — ED Triage Notes (Signed)
Pt reports crushed left hand at work. Pt denies pain, reports hand is numb

## 2020-01-16 NOTE — ED Notes (Signed)
See triage note  Presents with injury to left hand    States he crushed his hand  No deformity

## 2020-01-22 ENCOUNTER — Emergency Department
Admission: EM | Admit: 2020-01-22 | Discharge: 2020-01-23 | Disposition: A | Discharge status: Home / Self Care | Payer: Medicaid Other | Attending: Emergency Medicine | Admitting: Emergency Medicine

## 2020-01-22 ENCOUNTER — Other Ambulatory Visit: Payer: Self-pay

## 2020-01-22 DIAGNOSIS — F17210 Nicotine dependence, cigarettes, uncomplicated: Secondary | ICD-10-CM | POA: Insufficient documentation

## 2020-01-22 DIAGNOSIS — J45909 Unspecified asthma, uncomplicated: Secondary | ICD-10-CM | POA: Insufficient documentation

## 2020-01-22 DIAGNOSIS — K529 Noninfective gastroenteritis and colitis, unspecified: Secondary | ICD-10-CM | POA: Insufficient documentation

## 2020-01-22 DIAGNOSIS — Z79899 Other long term (current) drug therapy: Secondary | ICD-10-CM | POA: Insufficient documentation

## 2020-01-22 DIAGNOSIS — Z7951 Long term (current) use of inhaled steroids: Secondary | ICD-10-CM | POA: Insufficient documentation

## 2020-01-22 LAB — URINALYSIS, COMPLETE (UACMP) WITH MICROSCOPIC
Bacteria, UA: NONE SEEN
Bilirubin Urine: NEGATIVE
Glucose, UA: NEGATIVE mg/dL
Hgb urine dipstick: NEGATIVE
Ketones, ur: NEGATIVE mg/dL
Leukocytes,Ua: NEGATIVE
Nitrite: NEGATIVE
Protein, ur: NEGATIVE mg/dL
Specific Gravity, Urine: 1.013 (ref 1.005–1.030)
Squamous Epithelial / HPF: NONE SEEN (ref 0–5)
pH: 6 (ref 5.0–8.0)

## 2020-01-22 LAB — COMPREHENSIVE METABOLIC PANEL
ALT: 16 U/L (ref 0–44)
AST: 19 U/L (ref 15–41)
Albumin: 4.1 g/dL (ref 3.5–5.0)
Alkaline Phosphatase: 91 U/L (ref 38–126)
Anion gap: 10 (ref 5–15)
BUN: 11 mg/dL (ref 6–20)
CO2: 25 mmol/L (ref 22–32)
Calcium: 8.9 mg/dL (ref 8.9–10.3)
Chloride: 104 mmol/L (ref 98–111)
Creatinine, Ser: 1.1 mg/dL (ref 0.61–1.24)
GFR calc Af Amer: >60 mL/min (ref >60)
GFR calc non Af Amer: >60 mL/min (ref >60)
Glucose, Bld: 104 mg/dL — ABNORMAL HIGH (ref 70–99)
Potassium: 3.4 mmol/L — ABNORMAL LOW (ref 3.5–5.1)
Sodium: 139 mmol/L (ref 135–145)
Total Bilirubin: 0.8 mg/dL (ref 0.3–1.2)
Total Protein: 7.4 g/dL (ref 6.5–8.1)

## 2020-01-22 LAB — CBC
HCT: 40.3 % (ref 39.0–52.0)
Hemoglobin: 14.1 g/dL (ref 13.0–17.0)
MCH: 32.1 pg (ref 26.0–34.0)
MCHC: 35 g/dL (ref 30.0–36.0)
MCV: 91.8 fL (ref 80.0–100.0)
Platelets: 251 10*3/uL (ref 150–400)
RBC: 4.39 MIL/uL (ref 4.22–5.81)
RDW: 12.3 % (ref 11.5–15.5)
WBC: 7.3 10*3/uL (ref 4.0–10.5)
nRBC: 0 % (ref 0.0–0.2)

## 2020-01-22 LAB — LIPASE, BLOOD: Lipase: 38 U/L (ref 11–51)

## 2020-01-22 NOTE — ED Triage Notes (Signed)
PT to ED from home c/o abd pain (all over), vomiting and diarrhea since about 330 this afternoon. PT states he at some sushi at [facility] at about 130pm and thinks that might have been the cause. No fevers. PT in no acute distress.

## 2020-01-23 MED ORDER — ONDANSETRON 4 MG PO TBDP: 4.0000 mg | ORAL_TABLET | Freq: Three times a day (TID) | ORAL | 0 refills | Status: AC | PRN

## 2020-01-23 NOTE — ED Notes (Signed)
D/C for printed, signed and sent to HIM to be scanned into chart

## 2020-01-23 NOTE — ED Notes (Signed)
Pt verbalizes understanding of d/ instructions, medications and follow up.

## 2020-01-23 NOTE — ED Provider Notes (Signed)
[facility] Emergency Department Provider Note   ____________________________________________    I have reviewed the triage vital signs and the nursing notes.   HISTORY  Chief Complaint Emesis and Diarrhea     HPI Cody Craig. is a 49 y.o. male who presents with complaints of nausea vomiting and diarrhea.  Patient reports over the last 1.5 days he has felt "queasy ", several hours after eating he feels worse and has to vomit.  He has had diarrhea, brown, nonbloody as well.  No fevers or chills.  Has had both Covid vaccines.  Has not taken anything for this.  Was seen recently for an injured finger.  Currently is feeling improved, last vomited 2.5 hours ago.  Had loose stool 20 minutes ago.  No sick contacts, did recently eat sushi at a restaurant  Past Medical History:  Diagnosis Date  . Asthma   . HIV (human immunodeficiency virus infection) (HCC)   . Hyperlipemia   . Stroke [facility])     There are no problems to display for this patient.   Past Surgical History:  Procedure Laterality Date  . JOINT REPLACEMENT     shoulder    Prior to Admission medications   Medication Sig Start Date End Date Taking? Authorizing Provider  albuterol (PROVENTIL HFA;VENTOLIN HFA) 108 (90 Base) MCG/ACT inhaler Inhale 2 puffs into the lungs every 4 (four) hours as needed for wheezing or shortness of breath. 06/09/18   Dionne Bucy, MD  atorvastatin (LIPITOR) 40 MG tablet Take 20 mg by mouth daily.     [provider]  bictegravir-emtricitabine-tenofovir AF (BIKTARVY) 50-200-25 MG TABS tablet Take 1 tablet by mouth daily.    [provider]  budesonide-formoterol (SYMBICORT) 160-4.5 MCG/ACT inhaler Inhale 2 puffs into the lungs 2 (two) times daily.    Evie Lacks, NP  divalproex (DEPAKOTE ER) 500 MG 24 hr tablet Take 500 mg by mouth in the morning and at bedtime.     Ahluwalia, Shamsher S, MD  haloperidol (HALDOL) 10 MG tablet Take 5 mg  by mouth at bedtime.     Ahluwalia, Shamsher S, MD  loratadine (CLARITIN) 10 MG tablet Take 10 mg by mouth in the morning and at bedtime.    [provider]  meloxicam (MOBIC) 15 MG tablet Take 1 tablet (15 mg total) by mouth daily for 7 days. 01/16/20 01/23/20  Orvil Feil, PA-C  ondansetron (ZOFRAN ODT) 4 MG disintegrating tablet Take 1 tablet (4 mg total) by mouth every 8 (eight) hours as needed for nausea or vomiting. 01/04/20   Bridget Hartshorn L, PA-C  ondansetron (ZOFRAN ODT) 4 MG disintegrating tablet Take 1 tablet (4 mg total) by mouth every 8 (eight) hours as needed. 01/23/20   Jene Every, MD  Valbenazine Tosylate [facility]) 80 MG CAPS Take 1 capsule by mouth daily.    Katheren Puller, MD     Allergies Penicillins  No family history on file.  Social History Social History   Tobacco Use  . Smoking status: Current Every Day Smoker    Packs/day: 0.40    Types: Cigarettes  . Smokeless tobacco: Current User    Types: Chew  . Tobacco comment: Mailed Q[employer]line [place] brochure to pt. 09-04-18  Vaping Use  . Vaping Use: Unknown  Substance Use Topics  . Alcohol use: Yes  . Drug use: Not Currently    Types: "Crack" cocaine, Methamphetamines    Review of Systems  Constitutional: No fever/chills Eyes: No visual  changes.  ENT: No sore throat. Cardiovascular: Denies chest pain. Respiratory: Denies shortness of breath. Gastrointestinal: As above Genitourinary: Negative for dysuria. Musculoskeletal: Negative for back pain. Skin: Negative for rash. Neurological: Negative for headaches   ____________________________________________   PHYSICAL EXAM:  VITAL SIGNS: ED Triage Vitals  Enc Vitals Group     BP 01/22/20 1909 (!) 128/102     Pulse Rate 01/22/20 1909 70     Resp 01/22/20 1909 18     Temp 01/22/20 1909 98.8 F (37.1 C)     Temp Source 01/22/20 1909 Oral     SpO2 01/22/20 1909 97 %     Weight 01/22/20 1910 78.5 kg (173 lb)     Height 01/22/20 1910  1.727 m (5\' 8" )     Head Circumference --      Peak Flow --      Pain Score 01/22/20 1909 5     Pain Loc --      Pain Edu? --      Excl. in GC? --     Constitutional: Alert and oriented.  Nose: No congestion/rhinnorhea. Mouth/Throat: Mucous membranes are moist.    Cardiovascular: Normal rate, regular rhythm. Good peripheral circulation. Respiratory: Normal respiratory effort.  No retractions. Gastrointestinal: Soft and nontender. No distention.  No CVA tenderness.  Reassuring exam  Musculoskeletal: No lower extremity tenderness nor edema.  Warm and well perfused Neurologic:  Normal speech and language. No gross focal neurologic deficits are appreciated.  Skin:  Skin is warm, dry and intact. No rash noted. Psychiatric: Mood and affect are normal. Speech and behavior are normal.  ____________________________________________   LABS (all labs ordered are listed, but only abnormal results are displayed)  Labs Reviewed  COMPREHENSIVE METABOLIC PANEL - Abnormal; Notable for the following components:      Result Value   Potassium 3.4 (*)    Glucose, Bld 104 (*)    All other components within normal limits  URINALYSIS, COMPLETE (UACMP) WITH MICROSCOPIC - Abnormal; Notable for the following components:   Color, Urine YELLOW (*)    APPearance CLEAR (*)    All other components within normal limits  LIPASE, BLOOD  CBC   ____________________________________________  EKG  None ____________________________________________  RADIOLOGY  None ____________________________________________   PROCEDURES  Procedure(s) performed: No  Procedures   Critical Care performed: No ____________________________________________   INITIAL IMPRESSION / ASSESSMENT AND PLAN / ED COURSE  Pertinent labs & imaging results that were available during my care of the patient were reviewed by me and considered in my medical decision making (see chart for details).  Patient well-appearing and in  no acute distress, vital signs are reassuring.  Here for nausea vomiting diarrhea mild abdominal cramping.  Differential  includes viral gastroenteritis, foodborne illness, not consistent with COVID-19, patient vaccinated, no body aches fevers or chills.  Abdominal exam is quite reassuring.  Lab work is unremarkable, normal white blood cell count.  Normal urinalysis.  Recommend symptomatic treatment  Work note provided      ____________________________________________   FINAL CLINICAL IMPRESSION(S) / ED DIAGNOSES  Final diagnoses:  Gastroenteritis        Note:  This document was prepared using Dragon voice recognition software and may include unintentional dictation errors.   01/24/20, MD 01/23/20 [phone]

## 2020-02-21 ENCOUNTER — Telehealth: Payer: Self-pay | Admitting: Pharmacy Technician

## 2020-02-21 NOTE — Telephone Encounter (Signed)
Attempted to contact patient.  Someone answered the phone and stated that patient was in the shower getting ready for work.  Left name and phone number.  Patient asking for medication assistance for Ingrezza, Haloperidol & Depakote.  Patient can obtain Depakote through [facility].  Patient needs to provide 2021 proof of income in order to obtain Ingrezza and Haloperidol from [facility].  Sherilyn Dacosta Care Manager [facility]

## 2020-04-28 ENCOUNTER — Other Ambulatory Visit: Payer: Self-pay

## 2020-05-04 ENCOUNTER — Telehealth: Payer: Self-pay | Admitting: Pharmacy Technician

## 2020-05-04 NOTE — Telephone Encounter (Signed)
Patient provided partial 2021 financial information.  Still needs to provide paystubs before additional medication assistance can be provided.  Made patient aware that re-certification will start beginning 05/10/20 and he will also need to provide 2022 financial documentation by 09/06/20.  Sherilyn Dacosta Care Manager [facility]

## 2020-05-05 ENCOUNTER — Other Ambulatory Visit: Payer: Self-pay | Admitting: Registered Nurse

## 2020-05-06 ENCOUNTER — Other Ambulatory Visit: Payer: Self-pay | Admitting: Emergency Medicine

## 2020-05-06 ENCOUNTER — Emergency Department
Admission: EM | Admit: 2020-05-06 | Discharge: 2020-05-06 | Disposition: A | Discharge status: Home / Self Care | Payer: Medicaid Other | Attending: Emergency Medicine | Admitting: Emergency Medicine

## 2020-05-06 ENCOUNTER — Encounter: Payer: Self-pay | Admitting: *Deleted

## 2020-05-06 ENCOUNTER — Other Ambulatory Visit: Payer: Self-pay

## 2020-05-06 DIAGNOSIS — Z96619 Presence of unspecified artificial shoulder joint: Secondary | ICD-10-CM | POA: Insufficient documentation

## 2020-05-06 DIAGNOSIS — F17210 Nicotine dependence, cigarettes, uncomplicated: Secondary | ICD-10-CM | POA: Insufficient documentation

## 2020-05-06 DIAGNOSIS — Z760 Encounter for issue of repeat prescription: Secondary | ICD-10-CM | POA: Insufficient documentation

## 2020-05-06 DIAGNOSIS — J4521 Mild intermittent asthma with (acute) exacerbation: Secondary | ICD-10-CM | POA: Insufficient documentation

## 2020-05-06 DIAGNOSIS — B20 Human immunodeficiency virus [HIV] disease: Secondary | ICD-10-CM | POA: Insufficient documentation

## 2020-05-06 MED ORDER — METHYLPREDNISOLONE SODIUM SUCC 125 MG IJ SOLR
125.0000 mg | Freq: Once | INTRAMUSCULAR | Status: AC
  Administered 2020-05-06: 21:00:00 125 mg via INTRAMUSCULAR
  Filled 2020-05-06: qty 2

## 2020-05-06 MED ORDER — BENZONATATE 100 MG PO CAPS: 100.0000 mg | ORAL_CAPSULE | Freq: Four times a day (QID) | ORAL | 0 refills | Status: DC | PRN

## 2020-05-06 MED ORDER — ALBUTEROL SULFATE (2.5 MG/3ML) 0.083% IN NEBU
5.0000 mg | INHALATION_SOLUTION | Freq: Once | RESPIRATORY_TRACT | Status: AC
  Administered 2020-05-06: 21:00:00 5 mg via RESPIRATORY_TRACT
  Filled 2020-05-06: qty 6

## 2020-05-06 MED ORDER — ALBUTEROL SULFATE HFA 108 (90 BASE) MCG/ACT IN AERS
2.0000 | INHALATION_SPRAY | RESPIRATORY_TRACT | 2 refills | Status: DC | PRN
Start: 2020-05-06 — End: 2020-05-06

## 2020-05-06 NOTE — ED Notes (Signed)
Pt refused VS prior to DC. Pt A&O x4.  Pt stated he needed to leave urgently to respond to fire call. Pt given paperwork and walked out of dept.

## 2020-05-06 NOTE — ED Provider Notes (Signed)
[facility] Emergency Department Provider Note  ____________________________________________  Time seen: Approximately 8:23 PM  I have reviewed the triage vital signs and the nursing notes.   HISTORY  Chief Complaint Medication Refill    HPI Cody Craig. is a 49 y.o. male who presents the emergency department complaining of asthma symptoms.  Patient was seen at [facility] a week ago, tested negative for Covid and placed on steroid and instructed to use his albuterol at home.  Patient states that he ran out of his albuterol inhaler and has had continued wheezing.  No fevers or chills, nasal congestion, sore throat.  Has had cough and wheezing.  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  Patient is here requesting albuterol refill         Past Medical History:  Diagnosis Date  . Asthma   . HIV (human immunodeficiency virus infection) (HCC)   . Hyperlipemia   . Stroke [facility])     There are no problems to display for this patient.   Past Surgical History:  Procedure Laterality Date  . JOINT REPLACEMENT     shoulder    Prior to Admission medications   Medication Sig Start Date End Date Taking? Authorizing Provider  albuterol (VENTOLIN HFA) 108 (90 Base) MCG/ACT inhaler Inhale 2 puffs into the lungs every 4 (four) hours as needed for wheezing or shortness of breath. 05/06/20  Yes Demica Zook, Delorise Royals, PA-C  benzonatate (TESSALON PERLES) 100 MG capsule Take 1 capsule (100 mg total) by mouth every 6 (six) hours as needed. 05/06/20 05/06/21 Yes Benino Korinek, Delorise Royals, PA-C  atorvastatin (LIPITOR) 40 MG tablet Take 20 mg by mouth daily.     [provider]  bictegravir-emtricitabine-tenofovir AF (BIKTARVY) 50-200-25 MG TABS tablet Take 1 tablet by mouth daily.    [provider]  budesonide-formoterol (SYMBICORT) 160-4.5 MCG/ACT inhaler Inhale 2 puffs into the  lungs 2 (two) times daily.    Evie Lacks, NP  divalproex (DEPAKOTE ER) 500 MG 24 hr tablet Take 500 mg by mouth in the morning and at bedtime.     Ahluwalia, Shamsher S, MD  haloperidol (HALDOL) 10 MG tablet Take 5 mg by mouth at bedtime.     Ahluwalia, Shamsher S, MD  loratadine (CLARITIN) 10 MG tablet Take 10 mg by mouth in the morning and at bedtime.    [provider]  ondansetron (ZOFRAN ODT) 4 MG disintegrating tablet Take 1 tablet (4 mg total) by mouth every 8 (eight) hours as needed for nausea or vomiting. 01/04/20   Bridget Hartshorn L, PA-C  ondansetron (ZOFRAN ODT) 4 MG disintegrating tablet Take 1 tablet (4 mg total) by mouth every 8 (eight) hours as needed. 01/23/20   Jene Every, MD  Valbenazine Tosylate [facility]) 80 MG CAPS Take 1 capsule by mouth daily.    Katheren Puller, MD    Allergies Penicillins  No family history on file.  Social History Social History   Tobacco Use  . Smoking status: Current Every Day Smoker    Packs/day: 0.40    Types: Cigarettes  . Smokeless tobacco: Current User    Types: Chew  . Tobacco comment: Mailed Quitline [place] brochure to pt. 09-04-18  Vaping Use  . Vaping Use: Unknown  Substance Use Topics  . Alcohol use: Yes  . Drug use: Not Currently    Types: "Crack" cocaine, Methamphetamines  Review of Systems  Constitutional: No fever/chills Eyes: No visual changes. No discharge ENT: No upper respiratory complaints. Cardiovascular: no chest pain. Respiratory: Positive for mild shortness of breath, cough, mild wheezing. Gastrointestinal: No abdominal pain.  No nausea, no vomiting.  No diarrhea.  No constipation. Musculoskeletal: Negative for musculoskeletal pain. Skin: Negative for rash, abrasions, lacerations, ecchymosis. Neurological: Negative for headaches, focal weakness or numbness.  10 System ROS otherwise negative.  ____________________________________________   PHYSICAL EXAM:  VITAL SIGNS: ED  Triage Vitals [05/06/20 1916]  Enc Vitals Group     BP 117/81     Pulse Rate 94     Resp 18     Temp 98.6 F (37 C)     Temp Source Oral     SpO2 95 %     Weight      Height      Head Circumference      Peak Flow      Pain Score 0     Pain Loc      Pain Edu?      Excl. in GC?      Constitutional: Alert and oriented. Well appearing and in no acute distress. Eyes: Conjunctivae are normal. PERRL. EOMI. Head: Atraumatic. ENT:      Ears:       Nose: No congestion/rhinnorhea.      Mouth/Throat: Mucous membranes are moist.  Neck: No stridor.    Cardiovascular: Normal rate, regular rhythm. Normal S1 and S2.  Good peripheral circulation. Respiratory: Normal respiratory effort without tachypnea or retractions. Lungs with faint expiratory wheezing in all lung fields.  No inspiratory wheezing.  No rales or rhonchi.Peri [place] air entry to the bases with no decreased or absent breath sounds. Musculoskeletal: Full range of motion to all extremities. No gross deformities appreciated. Neurologic:  Normal speech and language. No gross focal neurologic deficits are appreciated.  Skin:  Skin is warm, dry and intact. No rash noted. Psychiatric: Mood and affect are normal. Speech and behavior are normal. Patient exhibits appropriate insight and judgement.   ____________________________________________   LABS (all labs ordered are listed, but only abnormal results are displayed)  Labs Reviewed - No data to display ____________________________________________  EKG   ____________________________________________  RADIOLOGY   No results found.  ____________________________________________    PROCEDURES  Procedure(s) performed:    Procedures    Medications  albuterol (PROVENTIL) (2.5 MG/3ML) 0.083% nebulizer solution 5 mg (5 mg Nebulization Given 05/06/20 2047)  methylPREDNISolone sodium succinate (SOLU-MEDROL) 125 mg/2 mL injection 125 mg (125 mg Intramuscular Given 05/06/20  2047)     ____________________________________________   INITIAL IMPRESSION / ASSESSMENT AND PLAN / ED COURSE  Pertinent labs & imaging results that were available during my care of the patient were reviewed by me and considered in my medical decision making (see chart for details).  Review of the [place] CSRS was performed in accordance of the [facility] prior to dispensing any controlled drugs.           Patient's diagnosis is consistent with asthma exacerbation.  Patient presented to emergency department complaining of asthma exacerbation symptoms.  Patient had already been evaluated at [facility] a week ago for these complaints with negative chest x-ray and Covid test.  No increase or change in symptoms, patient has run out of his albuterol at home.  Based off of physical exam I do feel that this is likely asthma exacerbation versus viral process.  No concern at this time for pneumonia.  I will not  repeat Covid testing or x-ray.  Patient will be given breathing treatment and steroids here in the emergency department.  I will refill his albuterol prior home use..Patient is given ED precautions to return to the ED for any worsening or new symptoms.     ____________________________________________  FINAL CLINICAL IMPRESSION(S) / ED DIAGNOSES  Final diagnoses:  Mild intermittent asthma with exacerbation  Medication refill      NEW MEDICATIONS STARTED DURING THIS VISIT:  ED Discharge Orders         Ordered    albuterol (VENTOLIN HFA) 108 (90 Base) MCG/ACT inhaler  Every 4 hours PRN        05/06/20 2037    benzonatate (TESSALON PERLES) 100 MG capsule  Every 6 hours PRN        05/06/20 2037              This chart was dictated using voice recognition software/Dragon. Despite best efforts to proofread, errors can occur which can change the meaning. Any change was purely unintentional.    Racheal Patches, PA-C 05/06/20 2131    Sharyn Creamer, MD 05/06/20 2242

## 2020-05-06 NOTE — ED Triage Notes (Signed)
Pt says that he is out of his inhaler. States that he has had shortness of breath and fatigue for about two weeks. Seen at [facility] ED for the same on the 21st, given inhaler and steroids for asthma related to the wood dust at work. Says he is out of his inhaler and needs a work note . No pain, no fevers.

## 2020-06-11 ENCOUNTER — Other Ambulatory Visit: Payer: Self-pay | Admitting: Registered Nurse

## 2020-06-22 ENCOUNTER — Telehealth: Payer: Self-pay | Admitting: Pharmacy Technician

## 2020-06-22 NOTE — Telephone Encounter (Signed)
Patient stated that he is currently unemployed.  Employment with [employer] ended on 05/31/20.  Is currently supported by sister, Kelson Queenan.  Mailed patient letter of support to be signed by Terisa Starr and to be notarized.  Sherilyn Dacosta Care Manager [facility]

## 2020-06-25 ENCOUNTER — Other Ambulatory Visit: Payer: Self-pay

## 2020-07-31 ENCOUNTER — Other Ambulatory Visit: Payer: Self-pay | Admitting: Registered Nurse

## 2020-08-08 ENCOUNTER — Other Ambulatory Visit: Payer: Self-pay

## 2020-08-08 ENCOUNTER — Emergency Department
Admission: EM | Admit: 2020-08-08 | Discharge: 2020-08-08 | Disposition: A | Discharge status: Home / Self Care | Payer: Medicaid Other | Attending: Emergency Medicine | Admitting: Emergency Medicine

## 2020-08-08 DIAGNOSIS — G43909 Migraine, unspecified, not intractable, without status migrainosus: Secondary | ICD-10-CM | POA: Insufficient documentation

## 2020-08-08 DIAGNOSIS — F17210 Nicotine dependence, cigarettes, uncomplicated: Secondary | ICD-10-CM | POA: Insufficient documentation

## 2020-08-08 DIAGNOSIS — Z96619 Presence of unspecified artificial shoulder joint: Secondary | ICD-10-CM | POA: Insufficient documentation

## 2020-08-08 DIAGNOSIS — Z21 Asymptomatic human immunodeficiency virus [HIV] infection status: Secondary | ICD-10-CM | POA: Insufficient documentation

## 2020-08-08 DIAGNOSIS — J45909 Unspecified asthma, uncomplicated: Secondary | ICD-10-CM | POA: Insufficient documentation

## 2020-08-08 MED ORDER — KETOROLAC TROMETHAMINE 30 MG/ML IJ SOLN
30.0000 mg | Freq: Once | INTRAMUSCULAR | Status: AC
  Administered 2020-08-08: 30 mg via INTRAVENOUS
  Filled 2020-08-08: qty 1

## 2020-08-08 MED ORDER — DIPHENHYDRAMINE HCL 50 MG/ML IJ SOLN
25.0000 mg | Freq: Once | INTRAMUSCULAR | Status: AC
  Administered 2020-08-08: 25 mg via INTRAVENOUS
  Filled 2020-08-08: qty 1

## 2020-08-08 MED ORDER — METOCLOPRAMIDE HCL 5 MG/ML IJ SOLN
10.0000 mg | Freq: Once | INTRAMUSCULAR | Status: AC
  Administered 2020-08-08: 10 mg via INTRAVENOUS
  Filled 2020-08-08: qty 2

## 2020-08-08 MED ORDER — SODIUM CHLORIDE 0.9 % IV BOLUS
1000.0000 mL | Freq: Once | INTRAVENOUS | Status: AC
  Administered 2020-08-08: 1000 mL via INTRAVENOUS

## 2020-08-08 NOTE — ED Triage Notes (Signed)
Pt presents to ER with c/o migraine.  Pt states he has pain in the right side of the front part of his head.  Pt states he is sensitive to light, has been nauseous, and having vertigo.  Pt states he does not take anything at home for migraines.  Pt A&O x4 at this time.

## 2020-08-08 NOTE — ED Provider Notes (Signed)
[facility] Emergency Department Provider Note ____________________________________________   Event Date/Time   First MD Initiated Contact with Patient 08/08/20 0045     (approximate)  I have reviewed the triage vital signs and the nursing notes.   HISTORY  Chief Complaint Migraine    HPI Nickie Deren. is a 50 y.o. male with PMH as noted below including HIV, CVA, and migraines who presents with headache, gradual onset today, primarily on the right side of his head, and associated with nausea and vertigo as well as some mild photophobia.  The patient reports a visual scotoma that looks like a "rainbow" which he has had previously with migraines.  The patient states that he has had migraines about 5 times since he had a stroke 2 years ago.  He states that the location and quality the pain is identical today to his prior migraines although it is slightly more severe than they have been previously.  Past Medical History:  Diagnosis Date  . Asthma   . HIV (human immunodeficiency virus infection) (HCC)   . Hyperlipemia   . Stroke [facility])     There are no problems to display for this patient.   Past Surgical History:  Procedure Laterality Date  . JOINT REPLACEMENT     shoulder    Prior to Admission medications   Medication Sig Start Date End Date Taking? Authorizing Provider  albuterol (VENTOLIN HFA) 108 (90 Base) MCG/ACT inhaler INHALE 2 PUFFS INTO THE LUNGS EVERY 4 (FOUR) HOURS AS NEEDED FOR WHEEZING OR SHORTNESS OF BREATH. 05/06/20 05/06/21  Cuthriell, Delorise Royals, PA-C  atorvastatin (LIPITOR) 40 MG tablet Take 20 mg by mouth daily.     [provider]  benzonatate (TESSALON) 100 MG capsule TAKE 1 CAPSULE (100 MG TOTAL) BY MOUTH EVERY 6 (SIX) HOURS AS NEEDED. 05/06/20 05/06/21  Cuthriell, Delorise Royals, PA-C  bictegravir-emtricitabine-tenofovir AF (BIKTARVY) 50-200-25 MG TABS tablet Take 1 tablet by mouth daily.    [provider]   budesonide-formoterol (SYMBICORT) 160-4.5 MCG/ACT inhaler Inhale 2 puffs into the lungs 2 (two) times daily.    Evie Lacks, NP  budesonide-formoterol (SYMBICORT) 160-4.5 MCG/ACT inhaler INHALE 2 PUFFS INTO THE LUNGS 2 TIMES A DAY FOR ASTHMA 05/05/20 05/05/21  Eula Flax, NP  divalproex (DEPAKOTE ER) 500 MG 24 hr tablet Take 500 mg by mouth in the morning and at bedtime.     Katheren Puller, MD  divalproex (DEPAKOTE ER) 500 MG 24 hr tablet TAKE TWO TABLETS BY MOUTH AT BEDTIME 06/25/20 11/05/20    gabapentin (NEURONTIN) 300 MG capsule TAKE ONE CAPSULE BY MOUTH 2 TIMES A DAY 07/31/20 07/31/21  Eula Flax, NP  haloperidol (HALDOL) 10 MG tablet Take 5 mg by mouth at bedtime.     Ahluwalia, Shamsher S, MD  haloperidol (HALDOL) 10 MG tablet TAKE ONE TABLET BY MOUTH AT BEDTIME 06/25/20 10/06/20    loratadine (CLARITIN) 10 MG tablet Take 10 mg by mouth in the morning and at bedtime.    [provider]  montelukast (SINGULAIR) 10 MG tablet TAKE ONE TABLET BY MOUTH EVERY DAY 06/11/20 06/11/21  Eula Flax, NP  ondansetron (ZOFRAN ODT) 4 MG disintegrating tablet Take 1 tablet (4 mg total) by mouth every 8 (eight) hours as needed for nausea or vomiting. 01/04/20   Bridget Hartshorn L, PA-C  ondansetron (ZOFRAN ODT) 4 MG disintegrating tablet Take 1 tablet (4 mg total) by mouth every 8 (eight) hours as needed. 01/23/20   Jene Every, MD  predniSONE (  DELTASONE) 20 MG tablet TAKE TWO TABLETS BY MOUTH EVERY DAY FOR 5 DAYS 04/28/20 04/28/21    Valbenazine Tosylate (INGREZZA) 80 MG CAPS Take 1 capsule by mouth daily.    Katheren Puller, MD    Allergies Penicillins  History reviewed. No pertinent family history.  Social History Social History   Tobacco Use  . Smoking status: Current Every Day Smoker    Packs/day: 1.00    Types: Cigarettes  . Smokeless tobacco: Current User    Types: Chew  . Tobacco comment: Mailed Quitline [place] brochure to pt. 09-04-18  Vaping Use  .  Vaping Use: Unknown  Substance Use Topics  . Alcohol use: Yes  . Drug use: Not Currently    Types: "Crack" cocaine, Methamphetamines    Review of Systems  Constitutional: No fever. Eyes: Positive for resolved visual scotoma. ENT: No sore throat. Cardiovascular: Denies chest pain. Respiratory: Denies shortness of breath. Gastrointestinal: Positive for nausea. Genitourinary: Negative for flank pain. Musculoskeletal: Negative for back pain. Skin: Negative for rash. Neurological: Positive for headache.  Negative for weakness or numbness.   ____________________________________________   PHYSICAL EXAM:  VITAL SIGNS: ED Triage Vitals  Enc Vitals Group     BP 08/08/20 0007 (!) 132/95     Pulse Rate 08/08/20 0007 72     Resp 08/08/20 0007 18     Temp 08/08/20 0007 98.1 F (36.7 C)     Temp Source 08/08/20 0007 Oral     SpO2 08/08/20 0007 96 %     Weight 08/08/20 0010 161 lb 8 oz (73.3 kg)     Height 08/08/20 0010 5\' 8"  (1.727 m)     Head Circumference --      Peak Flow --      Pain Score 08/08/20 0009 10     Pain Loc --      Pain Edu? --      Excl. in GC? --     Constitutional: Alert and oriented. Well appearing and in no acute distress. Eyes: Conjunctivae are normal.  EOMI.  PERRLA. Head: Atraumatic. Nose: No congestion/rhinnorhea. Mouth/Throat: Mucous membranes are moist.   Neck: Normal range of motion.  Cardiovascular: Normal rate, regular rhythm.  Good peripheral circulation. Respiratory: Normal respiratory effort.  No retractions. Gastrointestinal: No distention.  Musculoskeletal: Extremities warm and well perfused.  Neurologic:  Normal speech and language.  Motor intact in all extremities.  Normal coordination with no ataxia on finger-to-nose.  No pronator drift. Skin:  Skin is warm and dry. No rash noted. Psychiatric: Mood and affect are normal. Speech and behavior are normal.  ____________________________________________   LABS (all labs ordered are  listed, but only abnormal results are displayed)  Labs Reviewed - No data to display ____________________________________________  EKG   ____________________________________________  RADIOLOGY    ____________________________________________   PROCEDURES  Procedure(s) performed: No  Procedures  Critical Care performed: No ____________________________________________   INITIAL IMPRESSION / ASSESSMENT AND PLAN / ED COURSE  Pertinent labs & imaging results that were available during my care of the patient were reviewed by me and considered in my medical decision making (see chart for details).  50 year old male with PMH as noted above including migraines, stroke (with some mild residual left-sided weakness) and HIV presents with a headache that started gradually earlier today.  It is unilateral and associated with nausea, vertigo, and mild photophobia, identical in pattern to prior migraines.  On exam, the patient is overall well-appearing.  His vital signs are normal.  The  physical exam is unremarkable.  Neurologic exam is nonfocal.  Overall presentation is consistent with a recurrent migraine.  The patient states that the pain is slightly more intense than it has been previously although the overall location and pattern is consistent with his prior headaches.  Given the known history of migraines and the normal neurologic exam there is no indication for imaging at this time.  We will treat with Reglan, Benadryl, Toradol, and reassess.  ----------------------------------------- 5:06 AM on 08/08/2020 -----------------------------------------  The patient was able to sleep for a few hours.  He reports that his headache is completely resolved.  He appears comfortable.  He is stable for discharge at this time.  Return precautions given, and he expresses understanding.  ____________________________________________   FINAL CLINICAL IMPRESSION(S) / ED DIAGNOSES  Final  diagnoses:  Migraine without status migrainosus, not intractable, unspecified migraine type      NEW MEDICATIONS STARTED DURING THIS VISIT:  New Prescriptions   No medications on file     Note:  This document was prepared using Dragon voice recognition software and may include unintentional dictation errors.    Dionne Bucy, MD 08/08/20 [phone]

## 2020-08-08 NOTE — ED Notes (Signed)
Pt BP noted to be initially low. This RN went to assess pt and recheck BP. Pt noted to be laying on side. BP rechecked while pt laying on back, BP increased to 110/66.

## 2020-09-17 ENCOUNTER — Other Ambulatory Visit: Payer: Self-pay

## 2020-09-23 ENCOUNTER — Other Ambulatory Visit: Payer: Self-pay

## 2020-09-23 MED FILL — Divalproex Sodium Tab ER 24 HR 500 MG: ORAL | 20 days supply | Qty: 40 | Fill #0 | Status: AC

## 2020-09-23 MED FILL — Haloperidol Tab 10 MG: ORAL | 30 days supply | Qty: 30 | Fill #0 | Status: AC

## 2020-09-24 ENCOUNTER — Other Ambulatory Visit: Payer: Self-pay

## 2020-10-07 ENCOUNTER — Other Ambulatory Visit: Payer: Self-pay

## 2020-10-07 MED FILL — Albuterol Sulfate Inhal Aero 108 MCG/ACT (90MCG Base Equiv): RESPIRATORY_TRACT | 17 days supply | Qty: 6.7 | Fill #0 | Status: AC

## 2020-10-07 MED FILL — Gabapentin Cap 300 MG: ORAL | 30 days supply | Qty: 60 | Fill #0 | Status: AC

## 2020-10-09 ENCOUNTER — Other Ambulatory Visit: Payer: Self-pay

## 2020-10-09 ENCOUNTER — Telehealth: Payer: Self-pay | Admitting: Pharmacy Technician

## 2020-10-09 NOTE — Telephone Encounter (Signed)
Spoke with patient regarding financial information needed.  Patient to provide last 30 days of paystubs from [employer], bank statement and 2021 Federal Tax Return.  Explained to patient that we have been trying to get this information from him since February and we cannot provide medication assistance without this documentation.  Patient stated that he would bring this information into our clinic on 10/12/20.  Sherilyn Dacosta Care Manager [facility]

## 2020-11-27 ENCOUNTER — Other Ambulatory Visit: Payer: Self-pay
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
## 2021-04-27 ENCOUNTER — Other Ambulatory Visit: Payer: Self-pay

## 2021-07-02 ENCOUNTER — Encounter: Payer: Self-pay | Admitting: Emergency Medicine

## 2021-07-02 ENCOUNTER — Other Ambulatory Visit: Payer: Self-pay

## 2021-07-02 ENCOUNTER — Emergency Department
Admission: EM | Admit: 2021-07-02 | Discharge: 2021-07-02 | Disposition: A | Discharge status: Home / Self Care | Payer: 59 | Attending: Emergency Medicine | Admitting: Emergency Medicine

## 2021-07-02 DIAGNOSIS — Z79899 Other long term (current) drug therapy: Secondary | ICD-10-CM | POA: Insufficient documentation

## 2021-07-02 DIAGNOSIS — Z21 Asymptomatic human immunodeficiency virus [HIV] infection status: Secondary | ICD-10-CM | POA: Diagnosis not present

## 2021-07-02 DIAGNOSIS — R519 Headache, unspecified: Secondary | ICD-10-CM | POA: Diagnosis not present

## 2021-07-02 DIAGNOSIS — R69 Illness, unspecified: Secondary | ICD-10-CM | POA: Diagnosis not present

## 2021-07-02 DIAGNOSIS — G43909 Migraine, unspecified, not intractable, without status migrainosus: Secondary | ICD-10-CM | POA: Insufficient documentation

## 2021-07-02 DIAGNOSIS — J45909 Unspecified asthma, uncomplicated: Secondary | ICD-10-CM | POA: Diagnosis not present

## 2021-07-02 DIAGNOSIS — G43009 Migraine without aura, not intractable, without status migrainosus: Secondary | ICD-10-CM

## 2021-07-02 DIAGNOSIS — Z7951 Long term (current) use of inhaled steroids: Secondary | ICD-10-CM | POA: Insufficient documentation

## 2021-07-02 MED ORDER — SODIUM CHLORIDE 0.9 % IV BOLUS (SEPSIS): 1000.0000 mL | Freq: Once | INTRAVENOUS | Status: DC

## 2021-07-02 MED ORDER — KETOROLAC TROMETHAMINE 30 MG/ML IJ SOLN: 30.0000 mg | Freq: Once | INTRAMUSCULAR | Status: DC

## 2021-07-02 MED ORDER — ACETAMINOPHEN 500 MG PO TABS
1000.0000 mg | ORAL_TABLET | Freq: Once | ORAL | Status: AC
  Administered 2021-07-02: 1000 mg via ORAL
  Filled 2021-07-02: qty 2

## 2021-07-02 MED ORDER — METOCLOPRAMIDE HCL 5 MG/ML IJ SOLN: 10.0000 mg | Freq: Once | INTRAMUSCULAR | Status: DC

## 2021-07-02 MED ORDER — DIPHENHYDRAMINE HCL 50 MG/ML IJ SOLN
25.0000 mg | Freq: Once | INTRAMUSCULAR | Status: DC
Start: 2021-07-02 — End: 2021-07-02

## 2021-07-02 NOTE — ED Provider Notes (Signed)
[facility] Provider Note    Event Date/Time   First MD Initiated Contact with Patient 07/02/21 [phone]     (approximate)   History   No chief complaint on file.   HPI  Cody Craig. is a 51 y.o. male with history of HIV, asthma, hyperlipidemia, CVA, migraine headaches who presents to the emergency department with migraine headache today.  No numbness, tingling or weakness.  No neck pain, neck stiffness, fever.  No head injury pattern not on blood thinners.  States this feels similar to his previous migraines.  Has had some nausea but no vomiting.  Took Zofran prior to arrival.  He is requesting oral medications for his headache.  Requesting a work note for today.   History provided by patient.    Past Medical History:  Diagnosis Date   Asthma    HIV (human immunodeficiency virus infection) (HCC)    Hyperlipemia    Stroke [facility])     Past Surgical History:  Procedure Laterality Date   JOINT REPLACEMENT     shoulder    MEDICATIONS:  Prior to Admission medications   Medication Sig Start Date End Date Taking? Authorizing Provider  albuterol (VENTOLIN HFA) 108 (90 Base) MCG/ACT inhaler INHALE 2 PUFFS INTO THE LUNGS EVERY 4 (FOUR) HOURS AS NEEDED FOR WHEEZING OR SHORTNESS OF BREATH. 05/06/20 05/06/21  Cuthriell, Delorise Royals, PA-C  atorvastatin (LIPITOR) 40 MG tablet Take 20 mg by mouth daily.     [provider]  bictegravir-emtricitabine-tenofovir AF (BIKTARVY) 50-200-25 MG TABS tablet Take 1 tablet by mouth daily.    [provider]  budesonide-formoterol (SYMBICORT) 160-4.5 MCG/ACT inhaler Inhale 2 puffs into the lungs 2 (two) times daily.    Evie Lacks, NP  budesonide-formoterol (SYMBICORT) 160-4.5 MCG/ACT inhaler INHALE 2 PUFFS INTO THE LUNGS 2 TIMES A DAY FOR ASTHMA 05/05/20 05/05/21  Eula Flax, NP  divalproex (DEPAKOTE ER) 500 MG 24 hr tablet Take 500 mg by mouth in the morning and at bedtime.     Katheren Puller, MD  divalproex (DEPAKOTE ER) 500 MG 24 hr tablet TAKE TWO TABLETS BY MOUTH AT BEDTIME 06/25/20 11/05/20    gabapentin (NEURONTIN) 300 MG capsule TAKE ONE CAPSULE BY MOUTH 2 TIMES A DAY 07/31/20 07/31/21  Eula Flax, NP  haloperidol (HALDOL) 10 MG tablet Take 5 mg by mouth at bedtime.     Ahluwalia, Shamsher S, MD  haloperidol (HALDOL) 10 MG tablet TAKE ONE TABLET BY MOUTH AT BEDTIME 06/25/20 11/06/20    loratadine (CLARITIN) 10 MG tablet Take 10 mg by mouth in the morning and at bedtime.    [provider]  montelukast (SINGULAIR) 10 MG tablet TAKE ONE TABLET BY MOUTH EVERY DAY 06/11/20 06/11/21  Eula Flax, NP  ondansetron (ZOFRAN ODT) 4 MG disintegrating tablet Take 1 tablet (4 mg total) by mouth every 8 (eight) hours as needed for nausea or vomiting. 01/04/20   Bridget Hartshorn L, PA-C  ondansetron (ZOFRAN ODT) 4 MG disintegrating tablet Take 1 tablet (4 mg total) by mouth every 8 (eight) hours as needed. 01/23/20   Jene Every, MD  Valbenazine Tosylate [facility]) 80 MG CAPS Take 1 capsule by mouth daily.    Katheren Puller, MD    Physical Exam   Triage Vital Signs: ED Triage Vitals [07/02/21 0502]  Enc Vitals Group     BP 140/90     Pulse Rate 80     Resp 18     Temp 98 F (  36.7 C)     Temp Source Oral     SpO2 97 %     Weight 168 lb 14.4 oz (76.6 kg)     Height 5\' 8"  (1.727 m)     Head Circumference      Peak Flow      Pain Score 10     Pain Loc      Pain Edu?      Excl. in [place]?     Most recent vital signs: Vitals:   07/02/21 0502  BP: 140/90  Pulse: 80  Resp: 18  Temp: 98 F (36.7 C)  SpO2: 97%    CONSTITUTIONAL: Alert and oriented and responds appropriately to questions. Well-appearing; well-nourished HEAD: Normocephalic, atraumatic EYES: Conjunctivae clear, pupils appear equal, sclera nonicteric ENT: normal nose; moist mucous membranes NECK: Supple, normal ROM CARD: RRR; S1 and S2 appreciated; no murmurs, no clicks, no rubs, no  gallops RESP: Normal chest excursion without splinting or tachypnea; breath sounds clear and equal bilaterally; no wheezes, no rhonchi, no rales, no hypoxia or respiratory distress, speaking full sentences ABD/GI: Normal bowel sounds; non-distended; soft, non-tender, no rebound, no guarding, no peritoneal signs BACK: The back appears normal EXT: Normal ROM in all joints; no deformity noted, no edema; no cyanosis SKIN: Normal color for age and race; warm; no rash on exposed skin NEURO: Moves all extremities equally, normal speech, normal sensation diffusely, cranial nerves II through XII intact, normal gait PSYCH: The patient's mood and manner are appropriate.   ED Results / Procedures / Treatments   LABS: (all labs ordered are listed, but only abnormal results are displayed) Labs Reviewed - No data to display   EKG:     RADIOLOGY: My personal review and interpretation of imaging:    I have personally reviewed all radiology reports.   No results found.   PROCEDURES:  Critical Care performed: No   CRITICAL CARE Performed by: 07/04/21   Total critical care time: 0 minutes  Critical care time was exclusive of separately billable procedures and treating other patients.  Critical care was necessary to treat or prevent imminent or life-threatening deterioration.  Critical care was time spent personally by me on the following activities: development of treatment plan with patient and/or surrogate as well as nursing, discussions with consultants, evaluation of patient's response to treatment, examination of patient, obtaining history from patient or surrogate, ordering and performing treatments and interventions, ordering and review of laboratory studies, ordering and review of radiographic studies, pulse oximetry and re-evaluation of patient's condition.   Procedures    IMPRESSION / MDM / ASSESSMENT AND PLAN / ED COURSE  I reviewed the triage vital signs and the  nursing notes.    Patient here with migraine headache.  History of the same.      DIFFERENTIAL DIAGNOSIS (includes but not limited to):   Migraine, doubt intracranial hemorrhage, skull fracture, meningitis, CVT, encephalitis   PLAN: Here with a history of migraines having a green headache today.  It appears his headaches have improved previously with migraine cocktails but he declines this today stating that he only wants something by mouth.  He is requesting Tylenol.  He states he needs a work note and he needs to be discharged immediately as he "has important phone calls to make".   MEDICATIONS GIVEN IN ED: Medications  acetaminophen (TYLENOL) tablet 1,000 mg (1,000 mg Oral Given 07/02/21 0558)     ED COURSE: Initially offered migraine cocktail given this is helped his  headaches before but he declines.  Given Tylenol.  He did not want to wait to see if this helped his headache.  States he need his discharge papers immediately and a work note.  He declines any prescription medications.   At this time, I do not feel there is any life-threatening condition present. I reviewed all nursing notes, vitals, pertinent previous records.  All lab and urine results, EKGs, imaging ordered have been independently reviewed and interpreted by myself.  I reviewed all available radiology reports from any imaging ordered this visit.  Based on my assessment, I feel the patient is safe to be discharged home without further emergent workup and can continue workup as an outpatient as needed. Discussed all findings, treatment plan as well as usual and customary return precautions with patient.  They verbalize understanding and are comfortable with this plan.  Outpatient follow-up has been provided as needed.  All questions have been answered.    CONSULTS: No emergent condition present today therefore no admission or consult needed.   OUTSIDE RECORDS REVIEWED: Reviewed patient's last office visit with Dr.  Merrie Roof on 07/07/2020.  I do not see any recent notes from infectious disease.  His last viral load that I can see in our system was 170 on 07/07/2018 and CD4 count was 774.         FINAL CLINICAL IMPRESSION(S) / ED DIAGNOSES   Final diagnoses:  Migraine without aura and without status migrainosus, not intractable     Rx / DC Orders   ED Discharge Orders     None        Note:  This document was prepared using Dragon voice recognition software and may include unintentional dictation errors.   Nakyla Bracco, Layla Maw, DO 07/02/21 [phone]

## 2021-07-02 NOTE — Discharge Instructions (Addendum)
You may alternate Tylenol 1000 mg every 6 hours as needed for pain, fever and Ibuprofen 800 mg every 6-8 hours as needed for pain, fever.  Please take Ibuprofen with food.  Do not take more than 4000 mg of Tylenol (acetaminophen) in a 24 hour period. ° °

## 2021-07-02 NOTE — ED Triage Notes (Signed)
Patient ambulatory to triage with steady gait, without difficulty or distress noted; pt reports generalized HA since midnight accomp by nausea; st hx migraines

## 2021-07-21 DIAGNOSIS — S025XXA Fracture of tooth (traumatic), initial encounter for closed fracture: Secondary | ICD-10-CM | POA: Diagnosis not present

## 2021-07-21 DIAGNOSIS — Z7982 Long term (current) use of aspirin: Secondary | ICD-10-CM | POA: Diagnosis not present

## 2021-07-21 DIAGNOSIS — J449 Chronic obstructive pulmonary disease, unspecified: Secondary | ICD-10-CM | POA: Diagnosis not present

## 2021-07-21 DIAGNOSIS — Z880 Allergy status to penicillin: Secondary | ICD-10-CM | POA: Diagnosis not present

## 2021-07-21 DIAGNOSIS — R69 Illness, unspecified: Secondary | ICD-10-CM | POA: Diagnosis not present

## 2021-07-21 DIAGNOSIS — K0889 Other specified disorders of teeth and supporting structures: Secondary | ICD-10-CM | POA: Diagnosis not present

## 2021-07-21 DIAGNOSIS — X58XXXA Exposure to other specified factors, initial encounter: Secondary | ICD-10-CM | POA: Diagnosis not present

## 2021-07-21 DIAGNOSIS — K029 Dental caries, unspecified: Secondary | ICD-10-CM | POA: Diagnosis not present

## 2021-09-29 DIAGNOSIS — M545 Low back pain, unspecified: Secondary | ICD-10-CM | POA: Diagnosis not present

## 2021-09-29 DIAGNOSIS — J449 Chronic obstructive pulmonary disease, unspecified: Secondary | ICD-10-CM | POA: Diagnosis not present

## 2021-09-29 DIAGNOSIS — R69 Illness, unspecified: Secondary | ICD-10-CM | POA: Diagnosis not present

## 2021-09-29 DIAGNOSIS — R112 Nausea with vomiting, unspecified: Secondary | ICD-10-CM | POA: Diagnosis not present

## 2021-09-29 DIAGNOSIS — Z7982 Long term (current) use of aspirin: Secondary | ICD-10-CM | POA: Diagnosis not present

## 2021-09-29 DIAGNOSIS — R109 Unspecified abdominal pain: Secondary | ICD-10-CM | POA: Diagnosis not present

## 2021-09-29 DIAGNOSIS — Z880 Allergy status to penicillin: Secondary | ICD-10-CM | POA: Diagnosis not present

## 2021-09-29 DIAGNOSIS — Z79899 Other long term (current) drug therapy: Secondary | ICD-10-CM | POA: Diagnosis not present

## 2022-01-31 ENCOUNTER — Emergency Department: Payer: 59

## 2022-01-31 ENCOUNTER — Emergency Department
Admission: EM | Admit: 2022-01-31 | Discharge: 2022-01-31 | Disposition: A | Discharge status: Home / Self Care | Payer: 59 | Attending: Emergency Medicine | Admitting: Emergency Medicine

## 2022-01-31 ENCOUNTER — Other Ambulatory Visit: Payer: Self-pay

## 2022-01-31 DIAGNOSIS — S43014A Anterior dislocation of right humerus, initial encounter: Secondary | ICD-10-CM | POA: Insufficient documentation

## 2022-01-31 DIAGNOSIS — Z21 Asymptomatic human immunodeficiency virus [HIV] infection status: Secondary | ICD-10-CM | POA: Insufficient documentation

## 2022-01-31 DIAGNOSIS — J45909 Unspecified asthma, uncomplicated: Secondary | ICD-10-CM | POA: Insufficient documentation

## 2022-01-31 DIAGNOSIS — X58XXXA Exposure to other specified factors, initial encounter: Secondary | ICD-10-CM | POA: Insufficient documentation

## 2022-01-31 DIAGNOSIS — R69 Illness, unspecified: Secondary | ICD-10-CM | POA: Diagnosis not present

## 2022-01-31 DIAGNOSIS — S4991XA Unspecified injury of right shoulder and upper arm, initial encounter: Secondary | ICD-10-CM | POA: Diagnosis not present

## 2022-01-31 MED ORDER — METHOCARBAMOL 500 MG PO TABS
500.0000 mg | ORAL_TABLET | Freq: Once | ORAL | Status: AC
Start: 2022-01-31 — End: 2022-01-31
  Administered 2022-01-31: 500 mg via ORAL
  Filled 2022-01-31: qty 1

## 2022-01-31 MED ORDER — HYDROMORPHONE HCL 1 MG/ML IJ SOLN
1.0000 mg | Freq: Once | INTRAMUSCULAR | Status: AC
  Administered 2022-01-31: 1 mg via INTRAMUSCULAR
  Filled 2022-01-31: qty 1

## 2022-01-31 NOTE — ED Notes (Signed)
Pt chose to leave without discharge paperwork/vitals. MD explained discharge instructions to patient prior to departure.

## 2022-01-31 NOTE — ED Triage Notes (Signed)
Pt presents via POV c/o "dislocated right shoulder". Reports has hx of same.
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
## 2022-01-31 NOTE — ED Provider Triage Note (Signed)
Emergency Medicine Provider Triage Evaluation Note  Cody Craig. , a 51 y.o. male  was evaluated in triage.  Pt complains of shoulder injury and possible dislocation. Unable to RUE.  Review of Systems  Positive: Shoulder injury with possible dislocation Negative:   Physical Exam  BP (!) 124/99   Pulse 72   Temp 98.6 F (37 C) (Oral)   Resp 16   Ht 5\' 8"  (1.727 m)   Wt 79.4 kg   SpO2 99%   BMI 26.61 kg/m  Gen:   Awake, no distress   Resp:  Normal effort  MSK:   Deformity with palpable abn with likely dislocation of humeral head  Other:    Medical Decision Making  Medically screening exam initiated at 7:45 PM.  Appropriate orders placed.  Cody Craig. was informed that the remainder of the evaluation will be completed by another provider, this initial triage assessment does not replace that evaluation, and the importance of remaining in the ED until their evaluation is complete.  Xray, muscle relaxer for pain   Darletta Moll, PA-C 01/31/22 1945

## 2022-01-31 NOTE — ED Provider Notes (Signed)
[facility] Provider Note    Event Date/Time   First MD Initiated Contact with Patient 01/31/22 2035     (approximate)   History   Shoulder Pain   HPI  Cody Craig. is a 51 y.o. male with history of HIV, asthma, hyperlipidemia, stroke, and prior shoulder dislocations who presents with right shoulder pain acute on today when he was reaching for something.  He is unable to move it and it feels like prior dislocations.  He states that he has dislocated that shoulder possibly as many as 30 times.  He denies other injuries.    Physical Exam   Triage Vital Signs: ED Triage Vitals  Enc Vitals Group     BP 01/31/22 1941 (!) 124/99     Pulse Rate 01/31/22 1941 72     Resp 01/31/22 1941 16     Temp 01/31/22 1941 98.6 F (37 C)     Temp Source 01/31/22 1941 Oral     SpO2 01/31/22 1941 99 %     Weight 01/31/22 1943 175 lb (79.4 kg)     Height 01/31/22 1943 5\' 8"  (1.727 m)     Head Circumference --      Peak Flow --      Pain Score 01/31/22 1943 10     Pain Loc --      Pain Edu? --      Excl. in [place]? --     Most recent vital signs: Vitals:   01/31/22 1941  BP: (!) 124/99  Pulse: 72  Resp: 16  Temp: 98.6 F (37 C)  SpO2: 99%     General: Awake, no distress.  CV:  Good peripheral perfusion.  Resp:  Normal effort.  Abd:  No distention.  Other:  Right shoulder deformity consistent with anterior dislocation.  2+ radial pulse.  Intact motor and sensory in median, ulnar, and radial distributions.  Full range of motion at elbow.   ED Results / Procedures / Treatments   Labs (all labs ordered are listed, but only abnormal results are displayed) Labs Reviewed - No data to display   EKG     RADIOLOGY  XR R shoulder: I independently viewed and interpreted the images; there is an anterior shoulder dislocation  PROCEDURES:  Critical Care performed: No  Reduction of dislocation  Date/Time: 01/31/2022 9:48 PM  Performed by: Arta Silence, MD Authorized by: Arta Silence, MD  Consent: Verbal consent obtained. Risks and benefits: risks, benefits and alternatives were discussed Consent given by: patient Patient identity confirmed: verbally with patient Local anesthesia used: no  Anesthesia: Local anesthesia used: no  Sedation: Patient sedated: no  Comments: Attempt made to reduce shoulder via Cunningham method of direct manipulation without sedation.  The patient tolerated the procedure well but reduction was unsuccessful.      MEDICATIONS ORDERED IN ED: Medications  methocarbamol (ROBAXIN) tablet 500 mg (500 mg Oral Given 01/31/22 2010)  HYDROmorphone (DILAUDID) injection 1 mg (1 mg Intramuscular Given 01/31/22 2048)     IMPRESSION / MDM / ASSESSMENT AND PLAN / ED COURSE  I reviewed the triage vital signs and the nursing notes.  51 year old male presents with anterior shoulder dislocation with history of multiple dislocations in the past.  On exam the right arm is neuro/vascular intact.  X-ray confirms right anterior shoulder dislocation.  Differential diagnosis includes, but is not limited to, anterior shoulder dislocation.  Patient's presentation is most consistent with acute complicated illness / injury requiring diagnostic  workup.  The patient unfortunately ate a whole sandwich as he was being wheeled back into the room around 8:45 PM.  He stated that he would prefer not to be sedated for reduction if not necessary.  He was given IM Dilaudid with good improvement in his pain.  I attempted the [facility] reduction method of direct massage and manipulation for over 20 minutes but was unable to reduce the shoulder as the patient was not able to adequately relax.  The patient will almost certainly require moderate sedation.  Since this is not an emergent condition, the patient having eaten after ED arrival we will have to delay the procedure.  Guidelines recommend that he be n.p.o. for 4 hours prior  to sedation, so we will plan to attempt around 1 AM.  ----------------------------------------- 10:47 PM on 01/31/2022 -----------------------------------------  Patient was able to relax while lying down and the shoulder spontaneously reduced.  He now has full range of motion and is able to touch the opposite shoulder with his hand.  The patient is anxious to leave and declines repeat x-ray to confirm reduction.  He also declines to wait for written discharge paperwork.  I verbally counseled him on discharge instructions and return precautions and he expressed understanding.  He is discharged in stable condition.   FINAL CLINICAL IMPRESSION(S) / ED DIAGNOSES   Final diagnoses:  Anterior dislocation of right shoulder, initial encounter     Rx / DC Orders   ED Discharge Orders     None        Note:  This document was prepared using Dragon voice recognition software and may include unintentional dictation errors.    Arta Silence, MD 01/31/22 2248

## 2022-01-31 NOTE — ED Notes (Signed)
Writer educated patient on fall risk while sitting in chair when being administered narcotic pain medication. Pt declined to move into bed and chooses to remain in chair at this time.

## 2022-01-31 NOTE — ED Notes (Signed)
Pt lying in bed asleep, Writer woke pt up to request we place him on SpO2 monitor, pt refused.

## 2022-08-15 DIAGNOSIS — F251 Schizoaffective disorder, depressive type: Secondary | ICD-10-CM | POA: Diagnosis not present

## 2022-08-15 DIAGNOSIS — Z131 Encounter for screening for diabetes mellitus: Secondary | ICD-10-CM | POA: Diagnosis not present

## 2022-08-15 DIAGNOSIS — B20 Human immunodeficiency virus [HIV] disease: Secondary | ICD-10-CM | POA: Diagnosis not present

## 2022-08-15 DIAGNOSIS — Z1389 Encounter for screening for other disorder: Secondary | ICD-10-CM | POA: Diagnosis not present

## 2022-08-15 DIAGNOSIS — F17200 Nicotine dependence, unspecified, uncomplicated: Secondary | ICD-10-CM | POA: Diagnosis not present

## 2023-02-06 ENCOUNTER — Other Ambulatory Visit: Payer: Self-pay

## 2023-02-06 ENCOUNTER — Emergency Department
Admission: EM | Admit: 2023-02-06 | Discharge: 2023-02-06 | Disposition: A | Discharge status: Home / Self Care | Payer: MEDICAID | Attending: Emergency Medicine | Admitting: Emergency Medicine

## 2023-02-06 DIAGNOSIS — Z21 Asymptomatic human immunodeficiency virus [HIV] infection status: Secondary | ICD-10-CM | POA: Insufficient documentation

## 2023-02-06 DIAGNOSIS — J45909 Unspecified asthma, uncomplicated: Secondary | ICD-10-CM | POA: Diagnosis not present

## 2023-02-06 DIAGNOSIS — T1592XA Foreign body on external eye, part unspecified, left eye, initial encounter: Secondary | ICD-10-CM | POA: Diagnosis not present

## 2023-02-06 DIAGNOSIS — H5712 Ocular pain, left eye: Secondary | ICD-10-CM | POA: Diagnosis present

## 2023-02-06 DIAGNOSIS — W458XXA Other foreign body or object entering through skin, initial encounter: Secondary | ICD-10-CM | POA: Diagnosis not present

## 2023-02-06 MED ORDER — TETRACAINE HCL 0.5 % OP SOLN
2.0000 [drp] | Freq: Once | OPHTHALMIC | Status: AC
Start: 2023-02-06 — End: 2023-02-06
  Administered 2023-02-06: 2 [drp] via OPHTHALMIC
  Filled 2023-02-06: qty 4

## 2023-02-06 MED ORDER — POLYMYXIN B-TRIMETHOPRIM 10000-0.1 UNIT/ML-% OP SOLN
1.0000 [drp] | Freq: Four times a day (QID) | OPHTHALMIC | 0 refills | Status: AC
  Filled 2023-02-06: qty 10, 5d supply, fill #0

## 2023-02-06 MED ORDER — FLUORESCEIN SODIUM 1 MG OP STRP
1.0000 | ORAL_STRIP | Freq: Once | OPHTHALMIC | Status: AC
  Administered 2023-02-06: 1 via OPHTHALMIC
  Filled 2023-02-06: qty 1
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
## 2023-02-06 NOTE — ED Notes (Signed)
Patient was able to navigate the screens on his phone and read with no difficulty.
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
## 2023-02-20 ENCOUNTER — Other Ambulatory Visit: Payer: Self-pay

## 2023-02-22 ENCOUNTER — Other Ambulatory Visit: Payer: Self-pay

## 2023-02-22 ENCOUNTER — Emergency Department
Admission: EM | Admit: 2023-02-22 | Discharge: 2023-02-22 | Disposition: A | Discharge status: Home / Self Care | Payer: MEDICAID | Attending: Emergency Medicine | Admitting: Emergency Medicine

## 2023-02-22 ENCOUNTER — Encounter: Payer: Self-pay | Admitting: Emergency Medicine

## 2023-02-22 DIAGNOSIS — S0502XA Injury of conjunctiva and corneal abrasion without foreign body, left eye, initial encounter: Secondary | ICD-10-CM | POA: Diagnosis not present

## 2023-02-22 DIAGNOSIS — Z21 Asymptomatic human immunodeficiency virus [HIV] infection status: Secondary | ICD-10-CM | POA: Diagnosis not present

## 2023-02-22 DIAGNOSIS — X58XXXA Exposure to other specified factors, initial encounter: Secondary | ICD-10-CM | POA: Diagnosis not present

## 2023-02-22 DIAGNOSIS — H5712 Ocular pain, left eye: Secondary | ICD-10-CM

## 2023-02-22 MED ORDER — ERYTHROMYCIN 5 MG/GM OP OINT
TOPICAL_OINTMENT | Freq: Once | OPHTHALMIC | Status: AC
  Administered 2023-02-22: 1 via OPHTHALMIC
  Filled 2023-02-22: qty 1

## 2023-02-22 NOTE — Discharge Instructions (Signed)
Please use your erythromycin ointment 3 times a day approximately a quadrant strip in the lower eyelid for the next 5 to 7 days.  Please call the number provided for ophthalmology to arrange a follow-up appointment if you continue to have any discomfort or develop any visual changes.  Return immediately to the emergency department for any decreased vision, fever, or any other symptom concerning to yourself.
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
## 2023-02-22 NOTE — ED Triage Notes (Addendum)
Patient ambulatory to triage with steady gait, without difficulty or distress noted; pt reports being seen wk ago for foreign body in left eye; rx meds without relief of discomfort; denies any drainage or visual changes

## 2023-02-22 NOTE — ED Notes (Signed)
No answer in lobby when called to place patient in room; attempted to call patient but phone was not in service.

## 2023-07-25 ENCOUNTER — Encounter: Payer: Self-pay | Admitting: Emergency Medicine

## 2023-07-25 ENCOUNTER — Other Ambulatory Visit: Payer: Self-pay

## 2023-07-25 ENCOUNTER — Emergency Department
Admission: EM | Admit: 2023-07-25 | Discharge: 2023-07-25 | Disposition: A | Discharge status: Home / Self Care | Payer: MEDICAID | Attending: Emergency Medicine | Admitting: Emergency Medicine

## 2023-07-25 DIAGNOSIS — R519 Headache, unspecified: Secondary | ICD-10-CM | POA: Diagnosis present

## 2023-07-25 DIAGNOSIS — M79651 Pain in right thigh: Secondary | ICD-10-CM | POA: Insufficient documentation

## 2023-07-25 DIAGNOSIS — J45909 Unspecified asthma, uncomplicated: Secondary | ICD-10-CM | POA: Diagnosis not present

## 2023-07-25 DIAGNOSIS — Z8673 Personal history of transient ischemic attack (TIA), and cerebral infarction without residual deficits: Secondary | ICD-10-CM | POA: Diagnosis not present

## 2023-07-25 DIAGNOSIS — Z21 Asymptomatic human immunodeficiency virus [HIV] infection status: Secondary | ICD-10-CM | POA: Insufficient documentation

## 2023-07-25 LAB — CBC WITH DIFFERENTIAL/PLATELET
Abs Immature Granulocytes: 0.01 10*3/uL (ref 0.00–0.07)
Basophils Absolute: 0 10*3/uL (ref 0.0–0.1)
Basophils Relative: 1 %
Eosinophils Absolute: 0.1 10*3/uL (ref 0.0–0.5)
Eosinophils Relative: 2 %
HCT: 37.7 % — ABNORMAL LOW (ref 39.0–52.0)
Hemoglobin: 13 g/dL (ref 13.0–17.0)
Immature Granulocytes: 0 %
Lymphocytes Relative: 32 %
Lymphs Abs: 1.2 10*3/uL (ref 0.7–4.0)
MCH: 28.6 pg (ref 26.0–34.0)
MCHC: 34.5 g/dL (ref 30.0–36.0)
MCV: 83 fL (ref 80.0–100.0)
Monocytes Absolute: 0.6 10*3/uL (ref 0.1–1.0)
Monocytes Relative: 15 %
Neutro Abs: 2 10*3/uL (ref 1.7–7.7)
Neutrophils Relative %: 50 %
Platelets: 201 10*3/uL (ref 150–400)
RBC: 4.54 MIL/uL (ref 4.22–5.81)
RDW: 12.8 % (ref 11.5–15.5)
WBC: 3.9 10*3/uL — ABNORMAL LOW (ref 4.0–10.5)
nRBC: 0 % (ref 0.0–0.2)

## 2023-07-25 LAB — BASIC METABOLIC PANEL
Anion gap: 10 (ref 5–15)
BUN: 13 mg/dL (ref 6–20)
CO2: 25 mmol/L (ref 22–32)
Calcium: 9 mg/dL (ref 8.9–10.3)
Chloride: 101 mmol/L (ref 98–111)
Creatinine, Ser: 1.06 mg/dL (ref 0.61–1.24)
GFR, Estimated: >60 mL/min (ref >60)
Glucose, Bld: 110 mg/dL — ABNORMAL HIGH (ref 70–99)
Potassium: 3.9 mmol/L (ref 3.5–5.1)
Sodium: 136 mmol/L (ref 135–145)

## 2023-07-25 NOTE — ED Triage Notes (Signed)
 Patient to ED via POV for headache and a "jolly horse" in his right upper thigh. Hx of migraine and stroke. Denies visually difficulties or light sensitivity. Pt reports that he has drank a lot of water today.

## 2023-07-25 NOTE — ED Provider Notes (Signed)
 [facility] Emergency Department Provider Note     Event Date/Time   First MD Initiated Contact with Patient 07/25/23 1955     (approximate)   History   No chief complaint on file.   HPI  Cody Lal. is a 53 y.o. male with a history of stroke, HIV, HLD, and asthma, presents to the ED for report of a headache.  Patient denies any light sensitivity, vision change, vertigo, tinnitus, paralysis, weakness.  He does endorse some muscle tightness to the right upper thigh, that he describes as a charley horse.  He presents to the ED after work, at the advice of his employer.  By the patient's report, his sister woke him up in the early hours of the morning, claiming that he had been slurring his speech.  He does not recall, any incidence of slurred speech.  He would endorse some mild right frontal headache.  He denies any other symptoms at this time.  Patient presents in no acute distress, speaking in complete sentences, fully animated, and with no reports of any subjective slurred speech, or weakness.  Physical Exam   Triage Vital Signs: ED Triage Vitals  Encounter Vitals Group     BP 07/25/23 1814 117/84     Systolic BP Percentile --      Diastolic BP Percentile --      Pulse Rate 07/25/23 1814 85     Resp 07/25/23 1814 18     Temp 07/25/23 1814 98 F (36.7 C)     Temp src --      SpO2 07/25/23 1814 99 %     Weight 07/25/23 1818 166 lb (75.3 kg)     Height 07/25/23 1818 5\' 8"  (1.727 m)     Head Circumference --      Peak Flow --      Pain Score 07/25/23 1815 6     Pain Loc --      Pain Education --      Exclude from Growth Chart --     Most recent vital signs: Vitals:   07/25/23 1814 07/25/23 2000  BP: 117/84 126/68  Pulse: 85 83  Resp: 18 16  Temp: 98 F (36.7 C) 98.6 F (37 C)  SpO2: 99% 99%    General Awake, no distress. NAD A&O x 4 HEENT NCAT. PERRL. EOMI. normal fundi bilaterally.  No rhinorrhea. Mucous membranes are moist.   CV:  Good peripheral perfusion. RRR RESP:  Normal effort. CTA ABD:  No distention.  MSK:  Normal spinal alignment without midline tenderness, spasm, vomiting, or step-off.  Active range of motion of all extremities. NEURO: Cranial nerves II to XII grossly intact.  Normal UE/LE DTRs bilaterally.  Normal gait without ataxia.  No cerebellar ataxia appreciated.  Normal finger-nose exam.  Normal rapid alternating movements.  Normal tandem walk.   ED Results / Procedures / Treatments   Labs (all labs ordered are listed, but only abnormal results are displayed) Labs Reviewed  CBC WITH DIFFERENTIAL/PLATELET - Abnormal; Notable for the following components:      Result Value   WBC 3.9 (*)    HCT 37.7 (*)    All other components within normal limits  BASIC METABOLIC PANEL - Abnormal; Notable for the following components:   Glucose, Bld 110 (*)    All other components within normal limits    EKG   RADIOLOGY  Declined by patient  No results found.   PROCEDURES:  Critical Care  performed: No  Procedures   MEDICATIONS ORDERED IN ED: Medications - No data to display   IMPRESSION / MDM / ASSESSMENT AND PLAN / ED COURSE  I reviewed the triage vital signs and the nursing notes.                              Differential diagnosis includes, but is not limited to, intracranial hemorrhage, meningitis/encephalitis, previous head trauma, cavernous venous thrombosis, tension headache, temporal arteritis, migraine or migraine equivalent, idiopathic intracranial hypertension, and non-specific headache.   Patient's presentation is most consistent with acute presentation with potential threat to life or bodily function.  Patient's diagnosis is consistent with a non-intractable frontal headache.  No evidence of any cerebellar ataxia, paresthesias, stroke, or TIA.  Patient presents active and engaged, normal gait with no red flags on exam.  We discussed further evaluation including CT imaging  of the brain.  Patient declined at this time, requesting his discharge papers and declined any for any medication at this time.  Patient will be discharged home with instructions to take OTC Tylenol as needed. Patient is to follow up with his PCP as discussed, as needed or otherwise directed. Patient is given ED precautions to return to the ED for any worsening or new symptoms.   FINAL CLINICAL IMPRESSION(S) / ED DIAGNOSES   Final diagnoses:  Acute nonintractable headache, unspecified headache type     Rx / DC Orders   ED Discharge Orders     None        Note:  This document was prepared using Dragon voice recognition software and may include unintentional dictation errors.    Lissa Hoard, PA-C 07/25/23 2126    Chesley Noon, MD 07/25/23 [phone]

## 2023-07-25 NOTE — Discharge Instructions (Addendum)
 Your exam and labs are normal at this time.  You may take OTC Tylenol or Motrin as needed for headache pain relief.  Follow-up with your primary provider or return to the ED if needed.

## 2023-09-17 ENCOUNTER — Emergency Department
Admission: EM | Admit: 2023-09-17 | Discharge: 2023-09-17 | Disposition: A | Discharge status: Home / Self Care | Payer: MEDICAID | Attending: Emergency Medicine | Admitting: Emergency Medicine

## 2023-09-17 ENCOUNTER — Other Ambulatory Visit: Payer: Self-pay

## 2023-09-17 ENCOUNTER — Emergency Department: Payer: MEDICAID

## 2023-09-17 DIAGNOSIS — W208XXA Other cause of strike by thrown, projected or falling object, initial encounter: Secondary | ICD-10-CM | POA: Insufficient documentation

## 2023-09-17 DIAGNOSIS — Z21 Asymptomatic human immunodeficiency virus [HIV] infection status: Secondary | ICD-10-CM | POA: Diagnosis not present

## 2023-09-17 DIAGNOSIS — J45909 Unspecified asthma, uncomplicated: Secondary | ICD-10-CM | POA: Diagnosis not present

## 2023-09-17 DIAGNOSIS — S6992XA Unspecified injury of left wrist, hand and finger(s), initial encounter: Secondary | ICD-10-CM | POA: Diagnosis present

## 2023-09-17 DIAGNOSIS — S62627A Displaced fracture of medial phalanx of left little finger, initial encounter for closed fracture: Secondary | ICD-10-CM | POA: Diagnosis not present

## 2023-09-17 NOTE — ED Triage Notes (Signed)
 Pt sts that he was pulling out a tree yesterday and the chain got wrapped around his left pinky finger. Pt sts that his finger is tingling and swollen.

## 2023-09-17 NOTE — ED Provider Notes (Signed)
 [facility] Provider Note    Event Date/Time   First MD Initiated Contact with Patient 09/17/23 1034     (approximate)   History   Finger Injury   HPI  Cody Craig. is a 53 y.o. male history of HIV, CVA, hyperlipidemia and asthma presents emergency department with left fifth finger and hand pain which started yesterday.  He was trying to pull out a tree and the chain got wrapped around his pinky.  Now has swelling, bruising, tingling.  Also has pain in the area.  Called telemetry health and they told him come to the ER as he might need surgery on his finger.      Physical Exam   Triage Vital Signs: ED Triage Vitals [09/17/23 1031]  Encounter Vitals Group     BP (!) 129/95     Systolic BP Percentile      Diastolic BP Percentile      Pulse Rate 74     Resp 18     Temp 98 F (36.7 C)     Temp Source Oral     SpO2 100 %     Weight 167 lb (75.8 kg)     Height 5\' 8"  (1.727 m)     Head Circumference      Peak Flow      Pain Score 10     Pain Loc      Pain Education      Exclude from Growth Chart     Most recent vital signs: Vitals:   09/17/23 1031  BP: (!) 129/95  Pulse: 74  Resp: 18  Temp: 98 F (36.7 C)  SpO2: 100%     General: Awake, no distress.   CV:  Good peripheral perfusion.  Resp:  Normal effort. Abd:  No distention.   Other:  Left fifth finger with bruising, swelling, tenderness, does feel a little cooler when compared to the other finger but has brisk cap refill   ED Results / Procedures / Treatments   Labs (all labs ordered are listed, but only abnormal results are displayed) Labs Reviewed - No data to display   EKG     RADIOLOGY X-ray left hand    PROCEDURES:   Procedures  Critical Care:  no Chief Complaint  Patient presents with   Finger Injury      MEDICATIONS ORDERED IN ED: Medications - No data to display   IMPRESSION / MDM / ASSESSMENT AND PLAN / ED COURSE  I reviewed the triage  vital signs and the nursing notes.                              Differential diagnosis includes, but is not limited to, fracture, contusion, sprain, crush injury  Patient's presentation is most consistent with acute illness / injury with system symptoms.   X-ray left hand independently reviewed interpreted by me 7 a fracture of the left fifth middle phalanx, slightly displaced, pending radiology read  Nursing staff instructed to apply finger splint  I did offer the patient pain medication.  However he states she does not have any money and will just drink liquor.  He is to follow-up with orthopedics.  Return if worsening.  Discharged stable condition.      FINAL CLINICAL IMPRESSION(S) / ED DIAGNOSES   Final diagnoses:  Displaced fracture of middle phalanx of left little finger, initial encounter for closed fracture  Rx / DC Orders   ED Discharge Orders     None        Note:  This document was prepared using Dragon voice recognition software and may include unintentional dictation errors.    Delsie Figures, PA-C 09/17/23 1118    Ruth Cove, MD 09/17/23 [phone]
